# Patient Record
Sex: Female | Born: 1945 | Race: Black or African American | Hispanic: No | Smoking: Never smoker
Health system: Southern US, Community
[De-identification: ages and names within clinical notes are randomized; demographics above are authoritative.]

## PROBLEM LIST (undated history)

## (undated) DIAGNOSIS — G4730 Sleep apnea, unspecified: Secondary | ICD-10-CM

## (undated) DIAGNOSIS — C50919 Malignant neoplasm of unspecified site of unspecified female breast: Secondary | ICD-10-CM

## (undated) DIAGNOSIS — I780 Hereditary hemorrhagic telangiectasia: Secondary | ICD-10-CM

## (undated) DIAGNOSIS — D649 Anemia, unspecified: Secondary | ICD-10-CM

## (undated) DIAGNOSIS — I639 Cerebral infarction, unspecified: Secondary | ICD-10-CM

## (undated) DIAGNOSIS — J449 Chronic obstructive pulmonary disease, unspecified: Secondary | ICD-10-CM

## (undated) DIAGNOSIS — K219 Gastro-esophageal reflux disease without esophagitis: Secondary | ICD-10-CM

## (undated) DIAGNOSIS — G5730 Lesion of lateral popliteal nerve, unspecified lower limb: Secondary | ICD-10-CM

## (undated) DIAGNOSIS — R58 Hemorrhage, not elsewhere classified: Secondary | ICD-10-CM

## (undated) DIAGNOSIS — M5136 Other intervertebral disc degeneration, lumbar region: Secondary | ICD-10-CM

## (undated) DIAGNOSIS — H3320 Serous retinal detachment, unspecified eye: Secondary | ICD-10-CM

## (undated) DIAGNOSIS — I5020 Unspecified systolic (congestive) heart failure: Secondary | ICD-10-CM

## (undated) DIAGNOSIS — N3281 Overactive bladder: Secondary | ICD-10-CM

## (undated) DIAGNOSIS — I272 Pulmonary hypertension, unspecified: Secondary | ICD-10-CM

## (undated) DIAGNOSIS — I471 Supraventricular tachycardia: Secondary | ICD-10-CM

## (undated) DIAGNOSIS — J309 Allergic rhinitis, unspecified: Secondary | ICD-10-CM

## (undated) HISTORY — PX: ANEURYSM COILING: SHX5349

## (undated) HISTORY — PX: BREAST SURGERY: SHX581

## (undated) HISTORY — DX: Allergic rhinitis, unspecified: J30.9

## (undated) HISTORY — DX: Overactive bladder: N32.81

## (undated) HISTORY — DX: Anemia, unspecified: D64.9

## (undated) HISTORY — DX: Cerebral infarction, unspecified: I63.9

## (undated) HISTORY — PX: ABDOMINAL HYSTERECTOMY: SHX81

## (undated) HISTORY — DX: Gastro-esophageal reflux disease without esophagitis: K21.9

## (undated) HISTORY — DX: Malignant neoplasm of unspecified site of unspecified female breast: C50.919

## (undated) HISTORY — DX: Unspecified systolic (congestive) heart failure: I50.20

## (undated) HISTORY — PX: LUNG SURGERY: SHX703

## (undated) HISTORY — DX: Sleep apnea, unspecified: G47.30

## (undated) HISTORY — DX: Serous retinal detachment, unspecified eye: H33.20

## (undated) HISTORY — DX: Lesion of lateral popliteal nerve, unspecified lower limb: G57.30

## (undated) HISTORY — DX: Pulmonary hypertension, unspecified: I27.20

## (undated) HISTORY — DX: Other intervertebral disc degeneration, lumbar region: M51.36

## (undated) HISTORY — DX: Hemorrhage, not elsewhere classified: R58

## (undated) HISTORY — DX: Supraventricular tachycardia: I47.1

## (undated) HISTORY — DX: Chronic obstructive pulmonary disease, unspecified: J44.9

---

## 2004-03-14 ENCOUNTER — Emergency Department (HOSPITAL_COMMUNITY): Admission: EM | Admit: 2004-03-14 | Discharge: 2004-03-14 | Payer: Self-pay | Admitting: Emergency Medicine

## 2004-04-06 ENCOUNTER — Encounter (HOSPITAL_COMMUNITY): Admission: RE | Admit: 2004-04-06 | Discharge: 2004-04-10 | Payer: Self-pay | Admitting: Internal Medicine

## 2004-05-11 ENCOUNTER — Ambulatory Visit: Payer: Self-pay | Admitting: Oncology

## 2004-05-14 ENCOUNTER — Encounter (HOSPITAL_COMMUNITY): Admission: RE | Admit: 2004-05-14 | Discharge: 2004-08-12 | Payer: Self-pay | Admitting: Internal Medicine

## 2004-07-12 ENCOUNTER — Ambulatory Visit: Payer: Self-pay | Admitting: Oncology

## 2004-08-28 ENCOUNTER — Ambulatory Visit: Payer: Self-pay | Admitting: Oncology

## 2004-09-22 ENCOUNTER — Ambulatory Visit (HOSPITAL_COMMUNITY): Admission: AD | Admit: 2004-09-22 | Discharge: 2004-09-22 | Payer: Self-pay | Admitting: Pediatrics

## 2004-10-15 ENCOUNTER — Ambulatory Visit: Payer: Self-pay | Admitting: Oncology

## 2004-11-23 ENCOUNTER — Encounter (HOSPITAL_COMMUNITY): Admission: RE | Admit: 2004-11-23 | Discharge: 2005-02-21 | Payer: Self-pay | Admitting: Internal Medicine

## 2004-12-11 ENCOUNTER — Ambulatory Visit: Payer: Self-pay | Admitting: Hematology & Oncology

## 2004-12-18 ENCOUNTER — Ambulatory Visit (HOSPITAL_COMMUNITY): Admission: RE | Admit: 2004-12-18 | Discharge: 2004-12-18 | Payer: Self-pay | Admitting: Gastroenterology

## 2005-01-07 ENCOUNTER — Ambulatory Visit (HOSPITAL_COMMUNITY): Admission: RE | Admit: 2005-01-07 | Discharge: 2005-01-07 | Payer: Self-pay | Admitting: Gastroenterology

## 2005-02-11 ENCOUNTER — Ambulatory Visit: Payer: Self-pay | Admitting: Hematology & Oncology

## 2005-04-08 ENCOUNTER — Ambulatory Visit: Payer: Self-pay | Admitting: Hematology & Oncology

## 2005-04-23 ENCOUNTER — Ambulatory Visit (HOSPITAL_COMMUNITY): Admission: RE | Admit: 2005-04-23 | Discharge: 2005-04-23 | Payer: Self-pay | Admitting: Internal Medicine

## 2005-05-17 ENCOUNTER — Ambulatory Visit: Payer: Self-pay | Admitting: Hematology & Oncology

## 2005-06-20 ENCOUNTER — Ambulatory Visit (HOSPITAL_COMMUNITY): Admission: RE | Admit: 2005-06-20 | Discharge: 2005-06-20 | Payer: Self-pay | Admitting: Internal Medicine

## 2005-07-05 ENCOUNTER — Ambulatory Visit: Payer: Self-pay | Admitting: Hematology & Oncology

## 2005-07-05 LAB — CBC WITH DIFFERENTIAL/PLATELET
Basophils Absolute: 0 10*3/uL (ref 0.0–0.1)
Eosinophils Absolute: 0.2 10*3/uL (ref 0.0–0.5)
HGB: 9.8 g/dL — ABNORMAL LOW (ref 11.6–15.9)
NEUT#: 3.4 10*3/uL (ref 1.5–6.5)
RDW: 24.5 % — ABNORMAL HIGH (ref 11.3–14.5)
lymph#: 1 10*3/uL (ref 0.9–3.3)

## 2005-07-08 LAB — TRANSFERRIN RECEPTOR, SOLUABLE: Transferrin Receptor, Soluble: 13.5 mg/L — ABNORMAL HIGH (ref 1.9–4.4)

## 2005-08-12 LAB — CBC WITH DIFFERENTIAL/PLATELET
Eosinophils Absolute: 0.2 10*3/uL (ref 0.0–0.5)
MONO#: 0.4 10*3/uL (ref 0.1–0.9)
NEUT#: 3.2 10*3/uL (ref 1.5–6.5)
Platelets: 444 10*3/uL — ABNORMAL HIGH (ref 145–400)
RBC: 3.79 10*6/uL (ref 3.70–5.32)
RDW: 25.5 % — ABNORMAL HIGH (ref 11.3–14.5)
WBC: 4.9 10*3/uL (ref 3.9–10.0)

## 2005-08-14 LAB — TRANSFERRIN RECEPTOR, SOLUABLE: Transferrin Receptor, Soluble: 14 mg/L — ABNORMAL HIGH (ref 1.9–4.4)

## 2005-08-14 LAB — FERRITIN: Ferritin: 161 ng/mL (ref 10–291)

## 2005-08-15 ENCOUNTER — Ambulatory Visit: Payer: Self-pay | Admitting: Hematology & Oncology

## 2005-09-05 ENCOUNTER — Emergency Department (HOSPITAL_COMMUNITY): Admission: EM | Admit: 2005-09-05 | Discharge: 2005-09-05 | Payer: Self-pay | Admitting: Family Medicine

## 2005-09-20 ENCOUNTER — Encounter: Admission: RE | Admit: 2005-09-20 | Discharge: 2005-09-20 | Payer: Self-pay | Admitting: Hematology & Oncology

## 2005-09-26 ENCOUNTER — Ambulatory Visit (HOSPITAL_COMMUNITY): Admission: RE | Admit: 2005-09-26 | Discharge: 2005-09-26 | Payer: Self-pay | Admitting: Gastroenterology

## 2005-10-08 ENCOUNTER — Other Ambulatory Visit: Admission: RE | Admit: 2005-10-08 | Discharge: 2005-10-08 | Payer: Self-pay | Admitting: Obstetrics and Gynecology

## 2005-10-09 ENCOUNTER — Emergency Department (HOSPITAL_COMMUNITY): Admission: EM | Admit: 2005-10-09 | Discharge: 2005-10-09 | Payer: Self-pay | Admitting: Family Medicine

## 2005-10-15 ENCOUNTER — Ambulatory Visit (HOSPITAL_COMMUNITY): Admission: RE | Admit: 2005-10-15 | Discharge: 2005-10-15 | Payer: Self-pay | Admitting: Gastroenterology

## 2005-10-17 ENCOUNTER — Ambulatory Visit: Payer: Self-pay | Admitting: Hematology & Oncology

## 2005-11-07 ENCOUNTER — Emergency Department (HOSPITAL_COMMUNITY): Admission: EM | Admit: 2005-11-07 | Discharge: 2005-11-07 | Payer: Self-pay | Admitting: Emergency Medicine

## 2005-12-02 ENCOUNTER — Ambulatory Visit: Payer: Self-pay | Admitting: Hematology & Oncology

## 2005-12-02 LAB — CBC WITH DIFFERENTIAL/PLATELET
Eosinophils Absolute: 0.2 10*3/uL (ref 0.0–0.5)
MCV: 81.1 fL (ref 81.0–101.0)
MONO#: 0.4 10*3/uL (ref 0.1–0.9)
MONO%: 8.4 % (ref 0.0–13.0)
NEUT#: 2.7 10*3/uL (ref 1.5–6.5)
RBC: 3.75 10*6/uL (ref 3.70–5.32)
RDW: 18.3 % — ABNORMAL HIGH (ref 11.3–14.5)
WBC: 4.6 10*3/uL (ref 3.9–10.0)
lymph#: 1.2 10*3/uL (ref 0.9–3.3)

## 2005-12-04 LAB — COMPREHENSIVE METABOLIC PANEL
Albumin: 4 g/dL (ref 3.5–5.2)
Alkaline Phosphatase: 176 U/L — ABNORMAL HIGH (ref 39–117)
Glucose, Bld: 102 mg/dL — ABNORMAL HIGH (ref 70–99)
Potassium: 4.1 mEq/L (ref 3.5–5.3)
Sodium: 139 mEq/L (ref 135–145)
Total Protein: 7 g/dL (ref 6.0–8.3)

## 2005-12-04 LAB — TRANSFERRIN RECEPTOR, SOLUABLE: Transferrin Receptor, Soluble: 16.2 mg/L — ABNORMAL HIGH (ref 1.9–4.4)

## 2005-12-13 ENCOUNTER — Encounter: Admission: RE | Admit: 2005-12-13 | Discharge: 2005-12-27 | Payer: Self-pay | Admitting: Family Medicine

## 2005-12-17 ENCOUNTER — Encounter: Admission: RE | Admit: 2005-12-17 | Discharge: 2005-12-17 | Payer: Self-pay | Admitting: Gastroenterology

## 2005-12-19 LAB — CBC WITH DIFFERENTIAL/PLATELET
BASO%: 1.5 % (ref 0.0–2.0)
Basophils Absolute: 0.1 10*3/uL (ref 0.0–0.1)
EOS%: 4.8 % (ref 0.0–7.0)
HCT: 27.6 % — ABNORMAL LOW (ref 34.8–46.6)
HGB: 9 g/dL — ABNORMAL LOW (ref 11.6–15.9)
MCH: 25.2 pg — ABNORMAL LOW (ref 26.0–34.0)
MONO#: 0.4 10*3/uL (ref 0.1–0.9)
NEUT#: 3.6 10*3/uL (ref 1.5–6.5)
NEUT%: 61.5 % (ref 39.6–76.8)
Platelets: 431 10*3/uL — ABNORMAL HIGH (ref 145–400)
RBC: 3.56 10*6/uL — ABNORMAL LOW (ref 3.70–5.32)
RDW: 17 % — ABNORMAL HIGH (ref 11.3–14.5)
WBC: 5.8 10*3/uL (ref 3.9–10.0)
lymph#: 1.4 10*3/uL (ref 0.9–3.3)

## 2005-12-21 LAB — FERRITIN: Ferritin: 10 ng/mL (ref 10–291)

## 2005-12-21 LAB — TRANSFERRIN RECEPTOR, SOLUABLE: Transferrin Receptor, Soluble: 20.4 mg/L — ABNORMAL HIGH (ref 1.9–4.4)

## 2006-01-06 ENCOUNTER — Ambulatory Visit (HOSPITAL_COMMUNITY): Admission: RE | Admit: 2006-01-06 | Discharge: 2006-01-06 | Payer: Self-pay | Admitting: *Deleted

## 2006-02-03 ENCOUNTER — Ambulatory Visit: Payer: Self-pay | Admitting: Hematology & Oncology

## 2006-02-10 LAB — CBC WITH DIFFERENTIAL/PLATELET
Basophils Absolute: 0 10*3/uL (ref 0.0–0.1)
Eosinophils Absolute: 0.2 10*3/uL (ref 0.0–0.5)
HGB: 9.8 g/dL — ABNORMAL LOW (ref 11.6–15.9)
MONO#: 0.4 10*3/uL (ref 0.1–0.9)
MONO%: 8.6 % (ref 0.0–13.0)
NEUT#: 2.8 10*3/uL (ref 1.5–6.5)
RBC: 3.79 10*6/uL (ref 3.70–5.32)
RDW: 22.9 % — ABNORMAL HIGH (ref 11.3–14.5)
WBC: 4.5 10*3/uL (ref 3.9–10.0)
lymph#: 1.1 10*3/uL (ref 0.9–3.3)

## 2006-02-12 LAB — FERRITIN: Ferritin: 43 ng/mL (ref 10–291)

## 2006-02-12 LAB — TRANSFERRIN RECEPTOR, SOLUABLE: Transferrin Receptor, Soluble: 15.1 mg/L — ABNORMAL HIGH (ref 1.9–4.4)

## 2006-03-25 ENCOUNTER — Ambulatory Visit: Payer: Self-pay | Admitting: Hematology & Oncology

## 2006-04-14 LAB — CBC WITH DIFFERENTIAL/PLATELET
BASO%: 0.9 % (ref 0.0–2.0)
Basophils Absolute: 0 10*3/uL (ref 0.0–0.1)
EOS%: 4 % (ref 0.0–7.0)
Eosinophils Absolute: 0.2 10*3/uL (ref 0.0–0.5)
HCT: 28.2 % — ABNORMAL LOW (ref 34.8–46.6)
HGB: 8.8 g/dL — ABNORMAL LOW (ref 11.6–15.9)
LYMPH%: 21.2 % (ref 14.0–48.0)
MCH: 25.2 pg — ABNORMAL LOW (ref 26.0–34.0)
MCHC: 31.2 g/dL — ABNORMAL LOW (ref 32.0–36.0)
MCV: 80.7 fL — ABNORMAL LOW (ref 81.0–101.0)
MONO#: 0.3 10*3/uL (ref 0.1–0.9)
MONO%: 6.7 % (ref 0.0–13.0)
NEUT#: 3.5 10*3/uL (ref 1.5–6.5)
NEUT%: 67.2 % (ref 39.6–76.8)
Platelets: 471 10*3/uL — ABNORMAL HIGH (ref 145–400)
RBC: 3.49 10*6/uL — ABNORMAL LOW (ref 3.70–5.32)
RDW: 20.3 % — ABNORMAL HIGH (ref 11.3–14.5)
WBC: 5.2 10*3/uL (ref 3.9–10.0)
lymph#: 1.1 10*3/uL (ref 0.9–3.3)

## 2006-04-14 LAB — FERRITIN: Ferritin: 17 ng/mL (ref 10–291)

## 2006-05-07 ENCOUNTER — Inpatient Hospital Stay (HOSPITAL_COMMUNITY): Admission: EM | Admit: 2006-05-07 | Discharge: 2006-05-08 | Payer: Self-pay | Admitting: Emergency Medicine

## 2006-06-09 ENCOUNTER — Ambulatory Visit: Payer: Self-pay | Admitting: Hematology & Oncology

## 2006-06-11 LAB — CBC WITH DIFFERENTIAL/PLATELET
Eosinophils Absolute: 0.2 10*3/uL (ref 0.0–0.5)
HCT: 32.3 % — ABNORMAL LOW (ref 34.8–46.6)
HGB: 9.4 g/dL — ABNORMAL LOW (ref 11.6–15.9)
LYMPH%: 23.7 % (ref 14.0–48.0)
MONO#: 0.7 10*3/uL (ref 0.1–0.9)
NEUT#: 2.8 10*3/uL (ref 1.5–6.5)
NEUT%: 56.2 % (ref 39.6–76.8)
Platelets: 455 10*3/uL — ABNORMAL HIGH (ref 145–400)
WBC: 4.9 10*3/uL (ref 3.9–10.0)
lymph#: 1.2 10*3/uL (ref 0.9–3.3)

## 2006-06-13 ENCOUNTER — Ambulatory Visit (HOSPITAL_BASED_OUTPATIENT_CLINIC_OR_DEPARTMENT_OTHER): Admission: RE | Admit: 2006-06-13 | Discharge: 2006-06-13 | Payer: Self-pay | Admitting: Internal Medicine

## 2006-06-22 ENCOUNTER — Ambulatory Visit: Payer: Self-pay | Admitting: Internal Medicine

## 2006-07-16 ENCOUNTER — Encounter (HOSPITAL_COMMUNITY): Admission: RE | Admit: 2006-07-16 | Discharge: 2006-08-09 | Payer: Self-pay | Admitting: Hematology & Oncology

## 2006-07-16 LAB — CBC & DIFF AND RETIC
Basophils Absolute: 0.1 10*3/uL (ref 0.0–0.1)
EOS%: 4.1 % (ref 0.0–7.0)
Eosinophils Absolute: 0.2 10*3/uL (ref 0.0–0.5)
HCT: 26.2 % — ABNORMAL LOW (ref 34.8–46.6)
HGB: 7.5 g/dL — ABNORMAL LOW (ref 11.6–15.9)
IRF: 0.46 — ABNORMAL HIGH (ref 0.130–0.330)
MCH: 21.2 pg — ABNORMAL LOW (ref 26.0–34.0)
MCV: 73.7 fL — ABNORMAL LOW (ref 81.0–101.0)
MONO%: 8.2 % (ref 0.0–13.0)
NEUT#: 2.8 10*3/uL (ref 1.5–6.5)
NEUT%: 57.9 % (ref 39.6–76.8)
RDW: 17.5 % — ABNORMAL HIGH (ref 11.3–14.5)
RETIC #: 99 10*3/uL (ref 19.7–115.1)
lymph#: 1.4 10*3/uL (ref 0.9–3.3)

## 2006-07-18 LAB — TRANSFERRIN RECEPTOR, SOLUABLE: Transferrin Receptor, Soluble: 116.3 nmol/L

## 2006-07-18 LAB — TYPE & CROSSMATCH - CHCC

## 2006-07-23 ENCOUNTER — Ambulatory Visit: Payer: Self-pay | Admitting: Hematology & Oncology

## 2006-07-30 LAB — CBC & DIFF AND RETIC
BASO%: 1.6 % (ref 0.0–2.0)
Eosinophils Absolute: 0.4 10*3/uL (ref 0.0–0.5)
MCV: 77.2 fL — ABNORMAL LOW (ref 81.0–101.0)
MONO#: 0.6 10*3/uL (ref 0.1–0.9)
MONO%: 8.4 % (ref 0.0–13.0)
NEUT#: 5 10*3/uL (ref 1.5–6.5)
RBC: 4.47 10*6/uL (ref 3.70–5.32)
RDW: 21.8 % — ABNORMAL HIGH (ref 11.3–14.5)
RETIC #: 190 10*3/uL — ABNORMAL HIGH (ref 19.7–115.1)
Retic %: 4.3 % — ABNORMAL HIGH (ref 0.4–2.3)
WBC: 7.2 10*3/uL (ref 3.9–10.0)

## 2006-07-30 LAB — FERRITIN: Ferritin: 530 ng/mL — ABNORMAL HIGH (ref 10–291)

## 2006-08-26 LAB — CBC WITH DIFFERENTIAL/PLATELET
BASO%: 1.3 % (ref 0.0–2.0)
EOS%: 7.4 % — ABNORMAL HIGH (ref 0.0–7.0)
HCT: 36.8 % (ref 34.8–46.6)
LYMPH%: 35.2 % (ref 14.0–48.0)
MCH: 26 pg (ref 26.0–34.0)
MCHC: 31.5 g/dL — ABNORMAL LOW (ref 32.0–36.0)
MCV: 82.6 fL (ref 81.0–101.0)
MONO%: 5.7 % (ref 0.0–13.0)
NEUT%: 50.4 % (ref 39.6–76.8)
Platelets: 302 10*3/uL (ref 145–400)

## 2006-09-19 ENCOUNTER — Ambulatory Visit: Payer: Self-pay | Admitting: Hematology & Oncology

## 2006-09-19 LAB — CBC WITH DIFFERENTIAL/PLATELET
EOS%: 3.9 % (ref 0.0–7.0)
LYMPH%: 22.4 % (ref 14.0–48.0)
MCH: 26.2 pg (ref 26.0–34.0)
MCV: 82.8 fL (ref 81.0–101.0)
MONO%: 10.5 % (ref 0.0–13.0)
RBC: 3.82 10*6/uL (ref 3.70–5.32)
RDW: 21.6 % — ABNORMAL HIGH (ref 11.3–14.5)

## 2006-09-19 LAB — FERRITIN: Ferritin: 25 ng/mL (ref 10–291)

## 2006-11-12 ENCOUNTER — Ambulatory Visit: Payer: Self-pay | Admitting: Hematology & Oncology

## 2006-11-12 LAB — CBC WITH DIFFERENTIAL/PLATELET
BASO%: 0.2 % (ref 0.0–2.0)
EOS%: 5.1 % (ref 0.0–7.0)
LYMPH%: 35.5 % (ref 14.0–48.0)
MCH: 27.7 pg (ref 26.0–34.0)
MCHC: 33 g/dL (ref 32.0–36.0)
MONO#: 0.3 10*3/uL (ref 0.1–0.9)
Platelets: 310 10*3/uL (ref 145–400)
RBC: 4.13 10*6/uL (ref 3.70–5.32)
WBC: 4.9 10*3/uL (ref 3.9–10.0)
lymph#: 1.7 10*3/uL (ref 0.9–3.3)

## 2006-11-12 LAB — FERRITIN: Ferritin: 90 ng/mL (ref 10–291)

## 2006-11-25 ENCOUNTER — Inpatient Hospital Stay (HOSPITAL_COMMUNITY): Admission: RE | Admit: 2006-11-25 | Discharge: 2006-11-26 | Payer: Self-pay | Admitting: Interventional Radiology

## 2006-12-02 LAB — CBC WITH DIFFERENTIAL/PLATELET
BASO%: 0.8 % (ref 0.0–2.0)
Basophils Absolute: 0 10*3/uL (ref 0.0–0.1)
HCT: 30.5 % — ABNORMAL LOW (ref 34.8–46.6)
HGB: 9.9 g/dL — ABNORMAL LOW (ref 11.6–15.9)
MCHC: 32.5 g/dL (ref 32.0–36.0)
MONO#: 0.3 10*3/uL (ref 0.1–0.9)
NEUT%: 68.7 % (ref 39.6–76.8)
RDW: 19.9 % — ABNORMAL HIGH (ref 11.3–14.5)
WBC: 4.9 10*3/uL (ref 3.9–10.0)
lymph#: 1 10*3/uL (ref 0.9–3.3)

## 2006-12-30 ENCOUNTER — Inpatient Hospital Stay (HOSPITAL_COMMUNITY): Admission: EM | Admit: 2006-12-30 | Discharge: 2007-01-01 | Payer: Self-pay | Admitting: Emergency Medicine

## 2006-12-30 ENCOUNTER — Ambulatory Visit: Payer: Self-pay | Admitting: Cardiology

## 2006-12-30 ENCOUNTER — Encounter (INDEPENDENT_AMBULATORY_CARE_PROVIDER_SITE_OTHER): Payer: Self-pay | Admitting: Neurology

## 2006-12-31 ENCOUNTER — Encounter (INDEPENDENT_AMBULATORY_CARE_PROVIDER_SITE_OTHER): Payer: Self-pay | Admitting: Neurology

## 2007-01-14 ENCOUNTER — Encounter: Admission: RE | Admit: 2007-01-14 | Discharge: 2007-03-11 | Payer: Self-pay | Admitting: Neurology

## 2007-01-19 ENCOUNTER — Ambulatory Visit: Payer: Self-pay | Admitting: Hematology & Oncology

## 2007-01-19 LAB — CBC WITH DIFFERENTIAL/PLATELET
BASO%: 0 % (ref 0.0–2.0)
Eosinophils Absolute: 0.2 10*3/uL (ref 0.0–0.5)
LYMPH%: 25.7 % (ref 14.0–48.0)
MCHC: 31.6 g/dL — ABNORMAL LOW (ref 32.0–36.0)
MONO#: 0.4 10*3/uL (ref 0.1–0.9)
NEUT#: 3 10*3/uL (ref 1.5–6.5)
Platelets: 437 10*3/uL — ABNORMAL HIGH (ref 145–400)
RBC: 3.51 10*6/uL — ABNORMAL LOW (ref 3.70–5.32)
WBC: 4.8 10*3/uL (ref 3.9–10.0)
lymph#: 1.2 10*3/uL (ref 0.9–3.3)

## 2007-01-19 LAB — FERRITIN: Ferritin: 62 ng/mL (ref 10–291)

## 2007-01-28 ENCOUNTER — Ambulatory Visit (HOSPITAL_COMMUNITY): Admission: RE | Admit: 2007-01-28 | Discharge: 2007-01-28 | Payer: Self-pay | Admitting: Hematology & Oncology

## 2007-02-24 ENCOUNTER — Encounter (HOSPITAL_COMMUNITY): Admission: RE | Admit: 2007-02-24 | Discharge: 2007-03-11 | Payer: Self-pay | Admitting: Hematology & Oncology

## 2007-02-24 LAB — FERRITIN: Ferritin: 14 ng/mL (ref 10–291)

## 2007-02-24 LAB — CBC WITH DIFFERENTIAL/PLATELET
BASO%: 2.1 % — ABNORMAL HIGH (ref 0.0–2.0)
LYMPH%: 23 % (ref 14.0–48.0)
MCHC: 29.3 g/dL — ABNORMAL LOW (ref 32.0–36.0)
MONO#: 0.4 10*3/uL (ref 0.1–0.9)
RBC: 3.32 10*6/uL — ABNORMAL LOW (ref 3.70–5.32)
RDW: 16.4 % — ABNORMAL HIGH (ref 11.3–14.5)
WBC: 4.3 10*3/uL (ref 3.9–10.0)
lymph#: 1 10*3/uL (ref 0.9–3.3)

## 2007-02-25 LAB — TYPE & CROSSMATCH - CHCC

## 2007-03-09 ENCOUNTER — Ambulatory Visit: Payer: Self-pay | Admitting: Hematology & Oncology

## 2007-03-18 LAB — CBC WITH DIFFERENTIAL/PLATELET
Eosinophils Absolute: 0.2 10*3/uL (ref 0.0–0.5)
MCV: 84.6 fL (ref 81.0–101.0)
MONO%: 7.9 % (ref 0.0–13.0)
NEUT#: 4.5 10*3/uL (ref 1.5–6.5)
RBC: 3.8 10*6/uL (ref 3.70–5.32)
RDW: 24.2 % — ABNORMAL HIGH (ref 11.3–14.5)
WBC: 6.3 10*3/uL (ref 3.9–10.0)

## 2007-03-18 LAB — RETICULOCYTES
RETIC #: 229.2 10*3/uL — ABNORMAL HIGH (ref 19.7–115.1)
Retic %: 6.1 % — ABNORMAL HIGH (ref 0.4–2.3)

## 2007-03-20 ENCOUNTER — Ambulatory Visit (HOSPITAL_COMMUNITY): Admission: RE | Admit: 2007-03-20 | Discharge: 2007-03-20 | Payer: Self-pay | Admitting: Interventional Radiology

## 2007-04-06 LAB — CBC & DIFF AND RETIC
Basophils Absolute: 0.1 10*3/uL (ref 0.0–0.1)
Eosinophils Absolute: 0.2 10*3/uL (ref 0.0–0.5)
HCT: 29.9 % — ABNORMAL LOW (ref 34.8–46.6)
LYMPH%: 20.2 % (ref 14.0–48.0)
MCV: 90.6 fL (ref 81.0–101.0)
MONO#: 0.3 10*3/uL (ref 0.1–0.9)
MONO%: 7.1 % (ref 0.0–13.0)
NEUT#: 3.3 10*3/uL (ref 1.5–6.5)
NEUT%: 67.4 % (ref 39.6–76.8)
Platelets: 441 10*3/uL — ABNORMAL HIGH (ref 145–400)
WBC: 4.9 10*3/uL (ref 3.9–10.0)

## 2007-04-06 LAB — FERRITIN: Ferritin: 90 ng/mL (ref 10–291)

## 2007-04-27 ENCOUNTER — Ambulatory Visit: Payer: Self-pay | Admitting: *Deleted

## 2007-04-27 ENCOUNTER — Ambulatory Visit (HOSPITAL_COMMUNITY): Admission: RE | Admit: 2007-04-27 | Discharge: 2007-04-27 | Payer: Self-pay | Admitting: Interventional Radiology

## 2007-04-27 ENCOUNTER — Encounter (INDEPENDENT_AMBULATORY_CARE_PROVIDER_SITE_OTHER): Payer: Self-pay | Admitting: Interventional Radiology

## 2007-04-30 ENCOUNTER — Ambulatory Visit: Payer: Self-pay | Admitting: Hematology & Oncology

## 2007-05-05 LAB — CBC & DIFF AND RETIC
BASO%: 0.1 % (ref 0.0–2.0)
EOS%: 3 % (ref 0.0–7.0)
HCT: 33.1 % — ABNORMAL LOW (ref 34.8–46.6)
LYMPH%: 19.7 % (ref 14.0–48.0)
MCH: 28.3 pg (ref 26.0–34.0)
MCHC: 31.8 g/dL — ABNORMAL LOW (ref 32.0–36.0)
MONO%: 1 % (ref 0.0–13.0)
NEUT%: 76.2 % (ref 39.6–76.8)
Platelets: 418 10*3/uL — ABNORMAL HIGH (ref 145–400)
RBC: 3.72 10*6/uL (ref 3.70–5.32)
Retic %: 4.1 % — ABNORMAL HIGH (ref 0.4–2.3)

## 2007-05-05 LAB — FERRITIN: Ferritin: 293 ng/mL — ABNORMAL HIGH (ref 10–291)

## 2007-05-11 ENCOUNTER — Emergency Department (HOSPITAL_COMMUNITY): Admission: EM | Admit: 2007-05-11 | Discharge: 2007-05-11 | Payer: Self-pay | Admitting: Family Medicine

## 2007-05-18 ENCOUNTER — Emergency Department (HOSPITAL_COMMUNITY): Admission: EM | Admit: 2007-05-18 | Discharge: 2007-05-19 | Payer: Self-pay | Admitting: Emergency Medicine

## 2007-05-19 LAB — CBC WITH DIFFERENTIAL/PLATELET
Basophils Absolute: 0.1 10*3/uL (ref 0.0–0.1)
EOS%: 3.4 % (ref 0.0–7.0)
Eosinophils Absolute: 0.2 10*3/uL (ref 0.0–0.5)
HGB: 10 g/dL — ABNORMAL LOW (ref 11.6–15.9)
LYMPH%: 23.9 % (ref 14.0–48.0)
MCH: 28 pg (ref 26.0–34.0)
MCV: 93 fL (ref 81.0–101.0)
MONO%: 8.2 % (ref 0.0–13.0)
NEUT#: 3.2 10*3/uL (ref 1.5–6.5)
Platelets: 326 10*3/uL (ref 145–400)
RBC: 3.57 10*6/uL — ABNORMAL LOW (ref 3.70–5.32)

## 2007-06-02 ENCOUNTER — Ambulatory Visit (HOSPITAL_COMMUNITY): Admission: RE | Admit: 2007-06-02 | Discharge: 2007-06-02 | Payer: Self-pay | Admitting: Hematology & Oncology

## 2007-06-17 ENCOUNTER — Ambulatory Visit: Payer: Self-pay | Admitting: Hematology & Oncology

## 2007-06-19 ENCOUNTER — Emergency Department (HOSPITAL_COMMUNITY): Admission: EM | Admit: 2007-06-19 | Discharge: 2007-06-19 | Payer: Self-pay | Admitting: Emergency Medicine

## 2007-06-23 LAB — CBC & DIFF AND RETIC
BASO%: 0 % (ref 0.0–2.0)
EOS%: 5.5 % (ref 0.0–7.0)
HCT: 32.8 % — ABNORMAL LOW (ref 34.8–46.6)
IRF: 0.57 — ABNORMAL HIGH (ref 0.130–0.330)
MCH: 26.9 pg (ref 26.0–34.0)
MCHC: 31.6 g/dL — ABNORMAL LOW (ref 32.0–36.0)
MONO#: 0.5 10*3/uL (ref 0.1–0.9)
NEUT%: 60.2 % (ref 39.6–76.8)
RBC: 3.86 10*6/uL (ref 3.70–5.32)
Retic %: 4.7 % — ABNORMAL HIGH (ref 0.4–2.3)
WBC: 4 10*3/uL (ref 3.9–10.0)
lymph#: 0.9 10*3/uL (ref 0.9–3.3)

## 2007-06-25 LAB — TRANSFERRIN RECEPTOR, SOLUABLE: Transferrin Receptor, Soluble: 98.7 nmol/L

## 2007-06-25 LAB — FERRITIN: Ferritin: 163 ng/mL (ref 10–291)

## 2007-07-06 ENCOUNTER — Emergency Department (HOSPITAL_COMMUNITY): Admission: EM | Admit: 2007-07-06 | Discharge: 2007-07-06 | Payer: Self-pay | Admitting: Emergency Medicine

## 2007-07-27 ENCOUNTER — Ambulatory Visit: Payer: Self-pay | Admitting: Hematology & Oncology

## 2007-07-29 ENCOUNTER — Encounter (INDEPENDENT_AMBULATORY_CARE_PROVIDER_SITE_OTHER): Payer: Self-pay | Admitting: Internal Medicine

## 2007-07-29 ENCOUNTER — Ambulatory Visit: Payer: Self-pay | Admitting: Vascular Surgery

## 2007-07-29 ENCOUNTER — Encounter (INDEPENDENT_AMBULATORY_CARE_PROVIDER_SITE_OTHER): Payer: Self-pay | Admitting: Emergency Medicine

## 2007-07-29 ENCOUNTER — Observation Stay (HOSPITAL_COMMUNITY): Admission: EM | Admit: 2007-07-29 | Discharge: 2007-07-29 | Payer: Self-pay | Admitting: Emergency Medicine

## 2007-08-28 LAB — CBC WITH DIFFERENTIAL/PLATELET
BASO%: 0.9 % (ref 0.0–2.0)
HCT: 28.9 % — ABNORMAL LOW (ref 34.8–46.6)
HGB: 8.6 g/dL — ABNORMAL LOW (ref 11.6–15.9)
MCHC: 29.9 g/dL — ABNORMAL LOW (ref 32.0–36.0)
MONO#: 0.4 10*3/uL (ref 0.1–0.9)
NEUT%: 59.9 % (ref 39.6–76.8)
WBC: 5 10*3/uL (ref 3.9–10.0)
lymph#: 1.3 10*3/uL (ref 0.9–3.3)

## 2007-08-28 LAB — CHCC SMEAR

## 2007-09-15 ENCOUNTER — Ambulatory Visit: Payer: Self-pay | Admitting: Hematology & Oncology

## 2007-09-22 ENCOUNTER — Encounter: Payer: Self-pay | Admitting: Interventional Radiology

## 2007-10-15 LAB — CBC WITH DIFFERENTIAL (CANCER CENTER ONLY)
BASO#: 0 10*3/uL (ref 0.0–0.2)
BASO%: 0.4 % (ref 0.0–2.0)
HCT: 32.6 % — ABNORMAL LOW (ref 34.8–46.6)
HGB: 9.9 g/dL — ABNORMAL LOW (ref 11.6–15.9)
LYMPH#: 1 10*3/uL (ref 0.9–3.3)
LYMPH%: 19.9 % (ref 14.0–48.0)
MCHC: 30.5 g/dL — ABNORMAL LOW (ref 32.0–36.0)
MCV: 78 fL — ABNORMAL LOW (ref 81–101)
MONO#: 0.4 10*3/uL (ref 0.1–0.9)
NEUT%: 68.6 % (ref 39.6–80.0)
RDW: 17.8 % — ABNORMAL HIGH (ref 10.5–14.6)
WBC: 5 10*3/uL (ref 3.9–10.0)

## 2007-11-02 ENCOUNTER — Ambulatory Visit: Payer: Self-pay | Admitting: Hematology & Oncology

## 2007-11-02 LAB — CBC WITH DIFFERENTIAL/PLATELET
Eosinophils Absolute: 0.1 10*3/uL (ref 0.0–0.5)
HCT: 31.1 % — ABNORMAL LOW (ref 34.8–46.6)
LYMPH%: 25.8 % (ref 14.0–48.0)
MCV: 77.5 fL — ABNORMAL LOW (ref 81.0–101.0)
MONO#: 0.4 10*3/uL (ref 0.1–0.9)
MONO%: 9.5 % (ref 0.0–13.0)
NEUT#: 2.6 10*3/uL (ref 1.5–6.5)
NEUT%: 60.1 % (ref 39.6–76.8)
Platelets: 469 10*3/uL — ABNORMAL HIGH (ref 145–400)
RBC: 4.02 10*6/uL (ref 3.70–5.32)
WBC: 4.3 10*3/uL (ref 3.9–10.0)

## 2007-11-06 ENCOUNTER — Ambulatory Visit: Payer: Self-pay | Admitting: Hematology & Oncology

## 2007-12-24 ENCOUNTER — Ambulatory Visit: Payer: Self-pay | Admitting: Hematology & Oncology

## 2007-12-29 ENCOUNTER — Ambulatory Visit: Payer: Self-pay | Admitting: Hematology & Oncology

## 2007-12-29 LAB — CBC & DIFF AND RETIC
BASO%: 0.1 % (ref 0.0–2.0)
EOS%: 4 % (ref 0.0–7.0)
LYMPH%: 18.2 % (ref 14.0–48.0)
MCH: 25.8 pg — ABNORMAL LOW (ref 26.0–34.0)
MCHC: 31.1 g/dL — ABNORMAL LOW (ref 32.0–36.0)
MCV: 83.2 fL (ref 81.0–101.0)
MONO%: 5.5 % (ref 0.0–13.0)
Platelets: 398 10*3/uL (ref 145–400)
RBC: 3.92 10*6/uL (ref 3.70–5.32)
RDW: 21.8 % — ABNORMAL HIGH (ref 11.3–14.5)
RETIC #: 138.8 10*3/uL — ABNORMAL HIGH (ref 19.7–115.1)
Retic %: 3.5 % — ABNORMAL HIGH (ref 0.4–2.3)

## 2007-12-31 LAB — FERRITIN: Ferritin: 39 ng/mL (ref 10–291)

## 2007-12-31 LAB — TRANSFERRIN RECEPTOR, SOLUABLE: Transferrin Receptor, Soluble: 80.8 nmol/L

## 2008-01-09 ENCOUNTER — Emergency Department (HOSPITAL_COMMUNITY): Admission: EM | Admit: 2008-01-09 | Discharge: 2008-01-09 | Payer: Self-pay | Admitting: Emergency Medicine

## 2008-01-22 ENCOUNTER — Ambulatory Visit (HOSPITAL_BASED_OUTPATIENT_CLINIC_OR_DEPARTMENT_OTHER): Admission: RE | Admit: 2008-01-22 | Discharge: 2008-01-22 | Payer: Self-pay | Admitting: Otolaryngology

## 2008-01-22 ENCOUNTER — Encounter (INDEPENDENT_AMBULATORY_CARE_PROVIDER_SITE_OTHER): Payer: Self-pay | Admitting: Otolaryngology

## 2008-02-15 ENCOUNTER — Ambulatory Visit: Payer: Self-pay | Admitting: Hematology & Oncology

## 2008-02-18 ENCOUNTER — Ambulatory Visit: Payer: Self-pay | Admitting: Hematology & Oncology

## 2008-03-24 ENCOUNTER — Emergency Department (HOSPITAL_COMMUNITY): Admission: EM | Admit: 2008-03-24 | Discharge: 2008-03-24 | Payer: Self-pay | Admitting: Family Medicine

## 2008-04-29 ENCOUNTER — Ambulatory Visit: Payer: Self-pay | Admitting: Hematology & Oncology

## 2008-05-02 LAB — CBC WITH DIFFERENTIAL (CANCER CENTER ONLY)
BASO%: 0.4 % (ref 0.0–2.0)
EOS%: 5.6 % (ref 0.0–7.0)
HCT: 30.6 % — ABNORMAL LOW (ref 34.8–46.6)
LYMPH#: 1.1 10*3/uL (ref 0.9–3.3)
LYMPH%: 23.6 % (ref 14.0–48.0)
MCHC: 30.4 g/dL — ABNORMAL LOW (ref 32.0–36.0)
MCV: 83 fL (ref 81–101)
NEUT%: 63.3 % (ref 39.6–80.0)
RDW: 16.5 % — ABNORMAL HIGH (ref 10.5–14.6)

## 2008-05-02 LAB — TECHNOLOGIST REVIEW CHCC SATELLITE

## 2008-05-29 ENCOUNTER — Ambulatory Visit (HOSPITAL_BASED_OUTPATIENT_CLINIC_OR_DEPARTMENT_OTHER): Admission: RE | Admit: 2008-05-29 | Discharge: 2008-05-29 | Payer: Self-pay | Admitting: Internal Medicine

## 2008-06-08 LAB — COMPREHENSIVE METABOLIC PANEL
ALT: 23 U/L (ref 0–35)
AST: 15 U/L (ref 0–37)
CO2: 22 mEq/L (ref 19–32)
Calcium: 10.8 mg/dL — ABNORMAL HIGH (ref 8.4–10.5)
Chloride: 110 mEq/L (ref 96–112)
Creatinine, Ser: 0.75 mg/dL (ref 0.40–1.20)
Sodium: 141 mEq/L (ref 135–145)
Total Protein: 6.9 g/dL (ref 6.0–8.3)

## 2008-06-08 LAB — FERRITIN: Ferritin: 76 ng/mL (ref 10–291)

## 2008-06-08 LAB — CBC WITH DIFFERENTIAL (CANCER CENTER ONLY)
BASO%: 0.3 % (ref 0.0–2.0)
HCT: 33.9 % — ABNORMAL LOW (ref 34.8–46.6)
LYMPH%: 25.1 % (ref 14.0–48.0)
MCH: 26.5 pg (ref 26.0–34.0)
MCV: 87 fL (ref 81–101)
MONO#: 0.3 10*3/uL (ref 0.1–0.9)
MONO%: 7.1 % (ref 0.0–13.0)
NEUT%: 62.5 % (ref 39.6–80.0)
RDW: 15.9 % — ABNORMAL HIGH (ref 10.5–14.6)
WBC: 4.2 10*3/uL (ref 3.9–10.0)

## 2008-06-08 LAB — RETICULOCYTES (CHCC)
ABS Retic: 132.6 10*3/uL (ref 19.0–186.0)
Retic Ct Pct: 3.4 % — ABNORMAL HIGH (ref 0.4–3.1)

## 2008-06-12 ENCOUNTER — Ambulatory Visit: Payer: Self-pay | Admitting: Internal Medicine

## 2008-06-23 ENCOUNTER — Ambulatory Visit: Payer: Self-pay | Admitting: Hematology & Oncology

## 2008-06-24 LAB — TECHNOLOGIST REVIEW CHCC SATELLITE

## 2008-06-24 LAB — CBC WITH DIFFERENTIAL (CANCER CENTER ONLY)
BASO#: 0 10*3/uL (ref 0.0–0.2)
Eosinophils Absolute: 0.3 10*3/uL (ref 0.0–0.5)
HCT: 33.1 % — ABNORMAL LOW (ref 34.8–46.6)
HGB: 10.1 g/dL — ABNORMAL LOW (ref 11.6–15.9)
LYMPH%: 22.1 % (ref 14.0–48.0)
MCH: 25.8 pg — ABNORMAL LOW (ref 26.0–34.0)
MCV: 84 fL (ref 81–101)
MONO#: 0.4 10*3/uL (ref 0.1–0.9)
NEUT%: 62.9 % (ref 39.6–80.0)
Platelets: 413 10*3/uL — ABNORMAL HIGH (ref 145–400)
RBC: 3.92 10*6/uL (ref 3.70–5.32)
WBC: 4.7 10*3/uL (ref 3.9–10.0)

## 2008-06-24 LAB — RETICULOCYTES (CHCC): ABS Retic: 127.7 10*3/uL (ref 19.0–186.0)

## 2008-06-24 LAB — FERRITIN: Ferritin: 31 ng/mL (ref 10–291)

## 2008-07-26 ENCOUNTER — Emergency Department (HOSPITAL_COMMUNITY): Admission: EM | Admit: 2008-07-26 | Discharge: 2008-07-26 | Payer: Self-pay | Admitting: Family Medicine

## 2008-08-11 ENCOUNTER — Ambulatory Visit: Payer: Self-pay | Admitting: Hematology & Oncology

## 2008-08-12 LAB — CBC WITH DIFFERENTIAL (CANCER CENTER ONLY)
BASO#: 0 10*3/uL (ref 0.0–0.2)
Eosinophils Absolute: 0.3 10*3/uL (ref 0.0–0.5)
HGB: 10.5 g/dL — ABNORMAL LOW (ref 11.6–15.9)
LYMPH#: 1.3 10*3/uL (ref 0.9–3.3)
MONO#: 0.4 10*3/uL (ref 0.1–0.9)
NEUT#: 2.7 10*3/uL (ref 1.5–6.5)
Platelets: 351 10*3/uL (ref 145–400)
RBC: 4.03 10*6/uL (ref 3.70–5.32)
WBC: 4.8 10*3/uL (ref 3.9–10.0)

## 2008-08-12 LAB — RETICULOCYTES (CHCC)
RBC.: 4.05 MIL/uL (ref 3.87–5.11)
Retic Ct Pct: 2.2 % (ref 0.4–3.1)

## 2008-09-12 ENCOUNTER — Inpatient Hospital Stay (HOSPITAL_COMMUNITY): Admission: EM | Admit: 2008-09-12 | Discharge: 2008-09-15 | Payer: Self-pay | Admitting: Emergency Medicine

## 2008-09-12 IMAGING — CR DG CHEST 1V PORT
1 series · 1 of 1 positions shown · non-contrast
Comparison: none

CLINICAL DATA: Left-sided weakness

Portable chest at 7381:
Comparison CT chest 04/23/2005. Stable lines around the right hilum. Mild
enlargement of cardiac silhouette. No effusion. No confluent infiltrate. Right
subclavian Portacatheter extends to the low SVC.

[view not recorded]
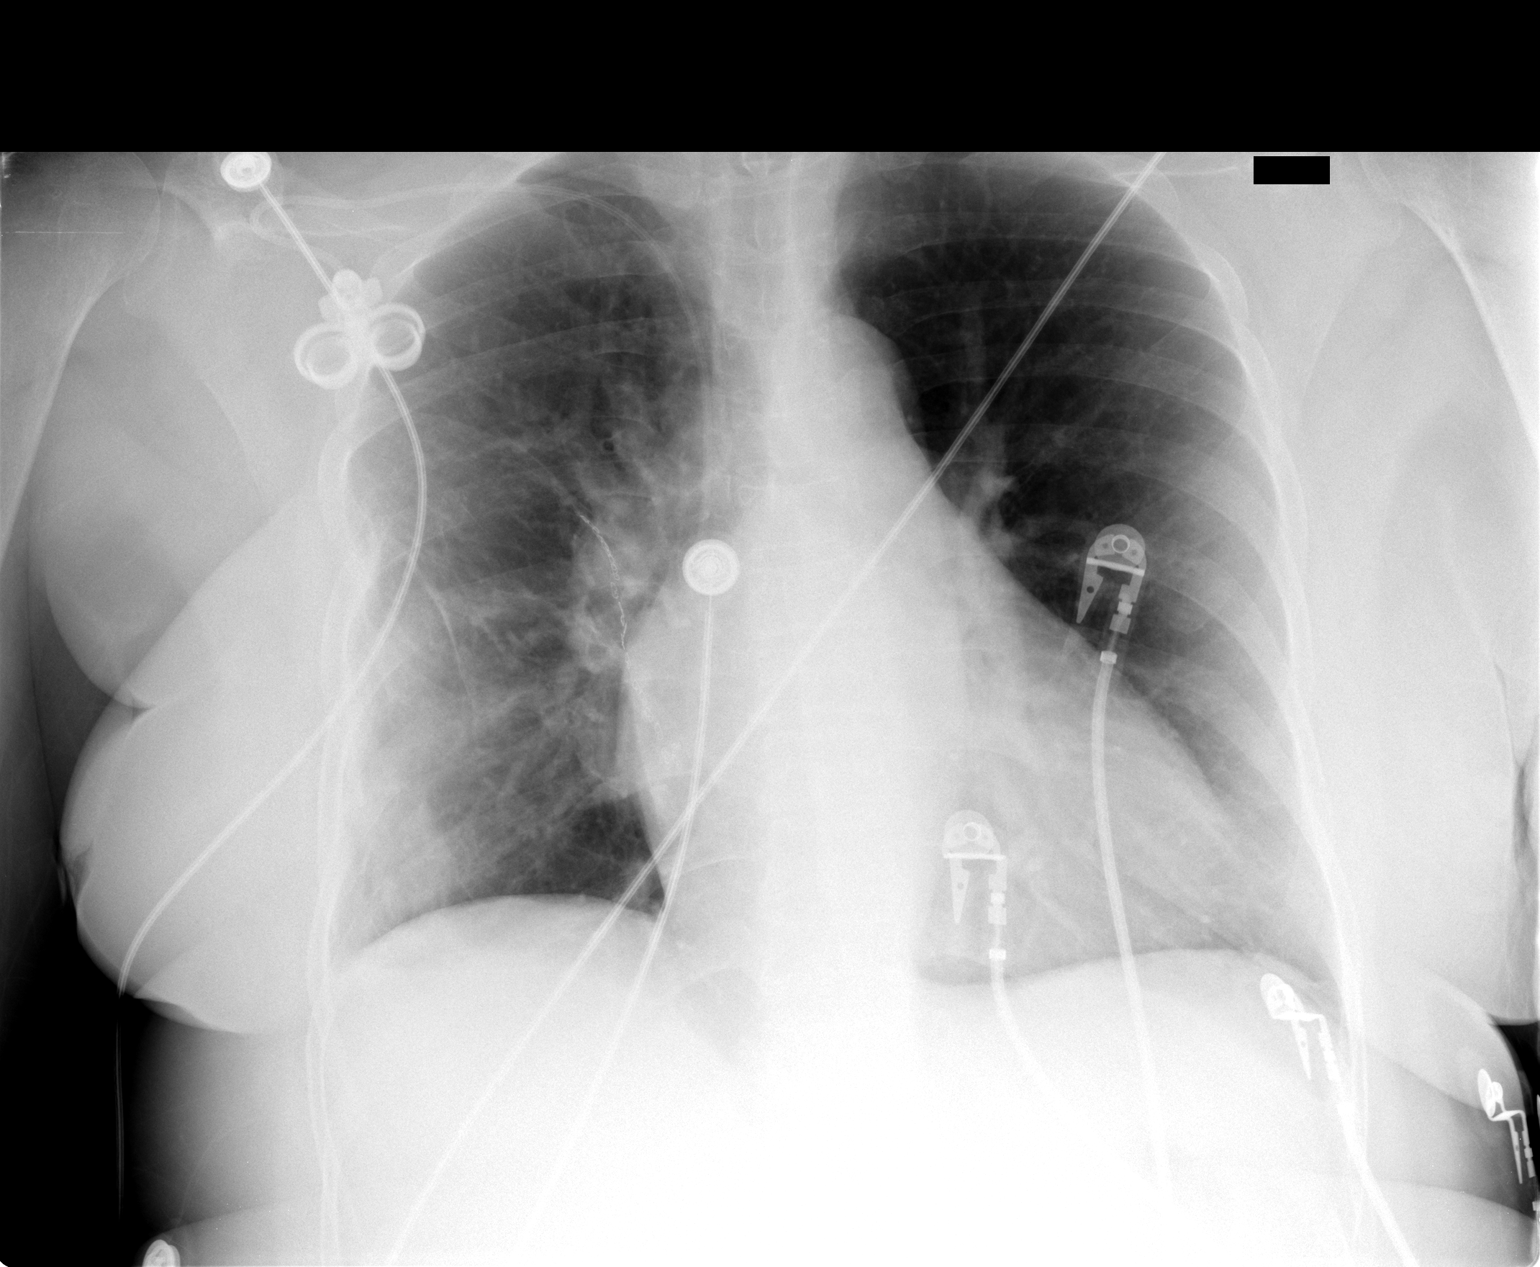

[1 of 1 positions shown; findings below may reference images not displayed]

IMPRESSION: 1. mild cardiomegaly.
2. Postop changes as above

## 2008-09-12 IMAGING — CT CT HEAD W/O CM
1 series · 16 of 29 positions shown, 20 images · IV contrast (agent unspecified)
Comparison: none

CLINICAL DATA: 60 year-old-female with weakness, code stroke, left-sided paresthesias.  
HEAD CT WITHOUT CONTRAST:
TECHNIQUE: Contiguous axial images were obtained from the base of the skull through the vertex according to standard protocol without contrast.

[Series 2: do not angle the gantry · axial · 0.47mm/px · z∈[+107,+237]mm · 16 of 29 slices shown, 20 images]
[im 2/29  brain]
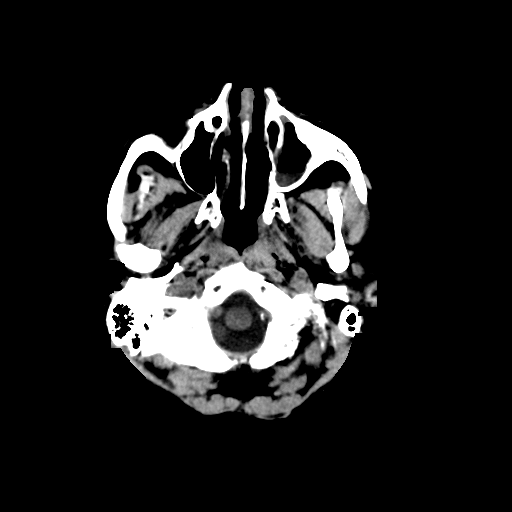
[im 2/29  bone]
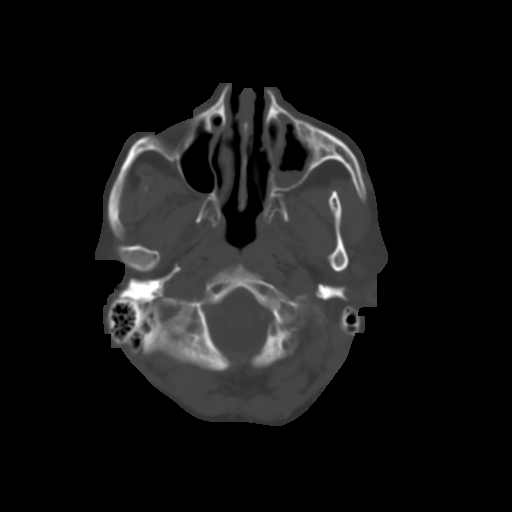
[im 4/29  brain]
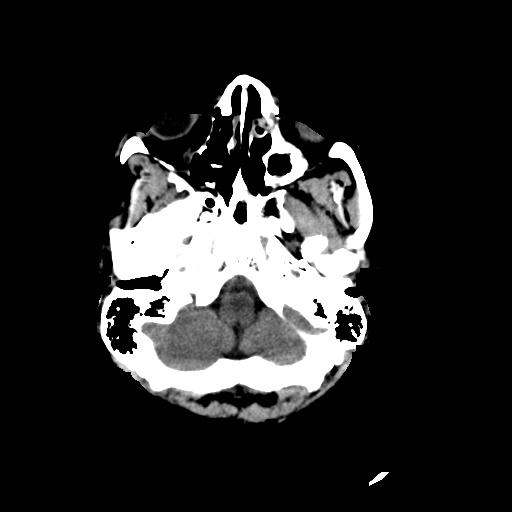
[im 6/29  brain]
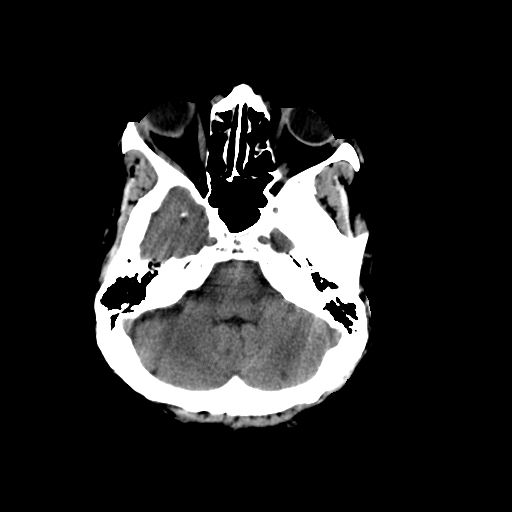
[im 7/29  brain]
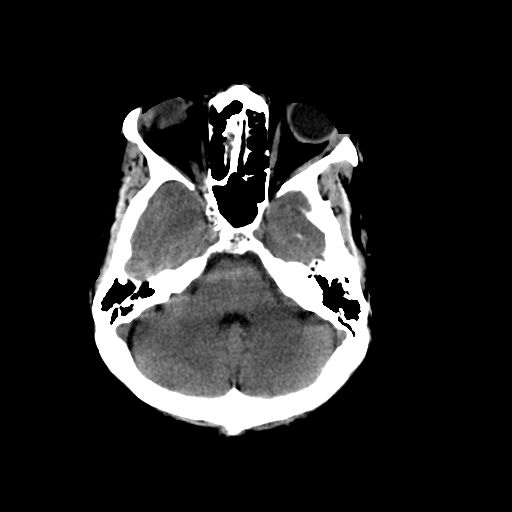
[im 9/29  brain]
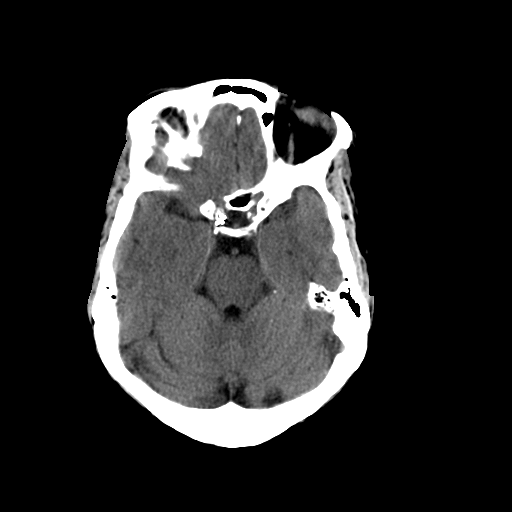
[im 9/29  bone]
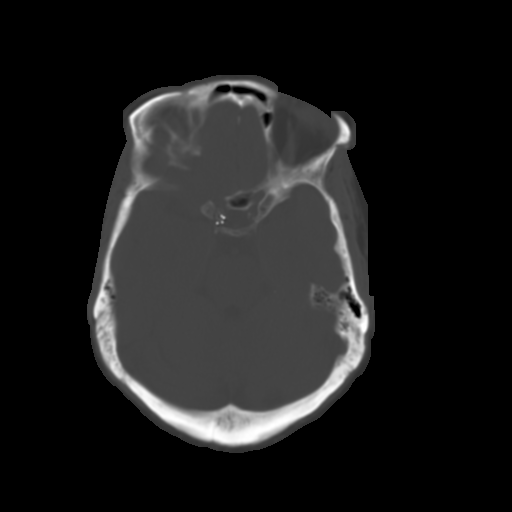
[im 11/29  brain]
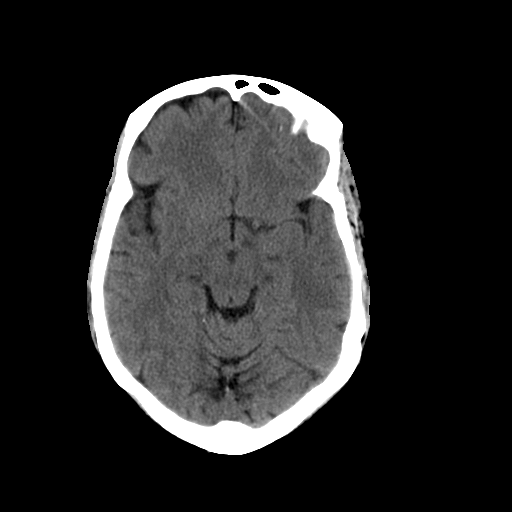
[im 12/29  brain]
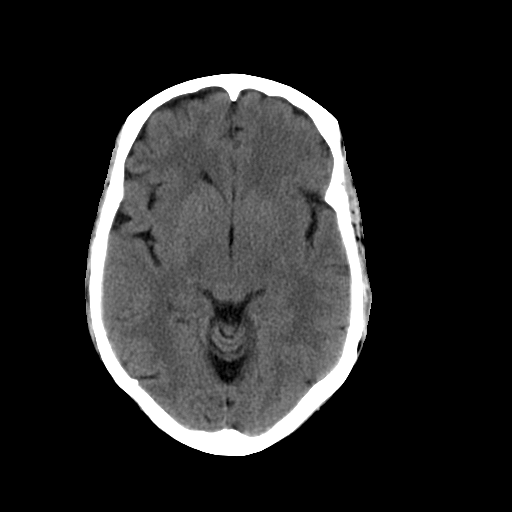
[im 14/29  brain]
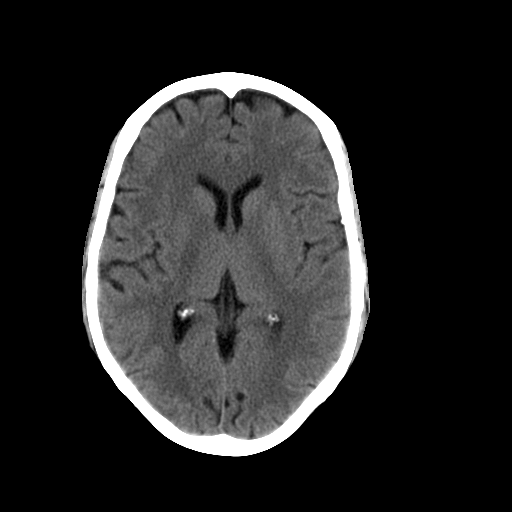
[im 16/29  brain]
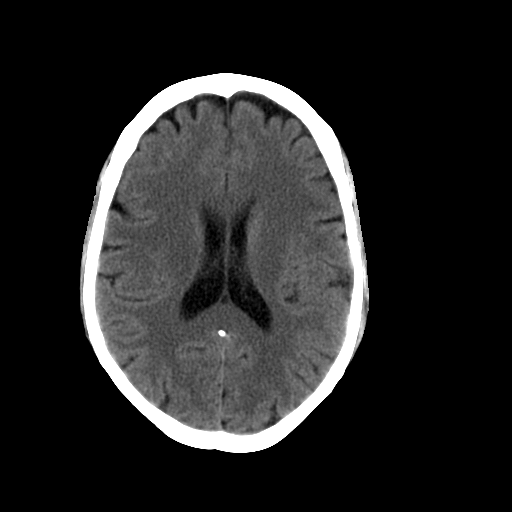
[im 16/29  bone]
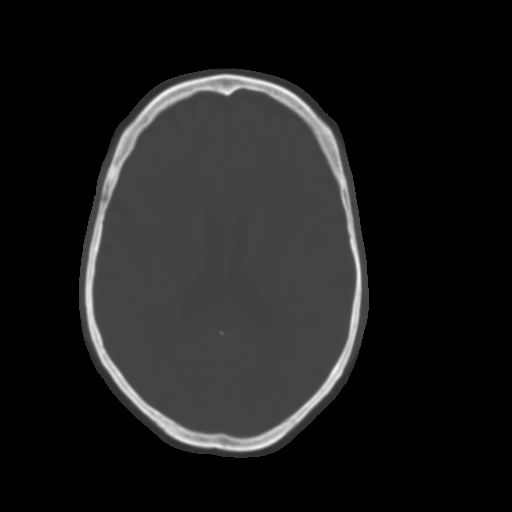
[im 18/29  brain]
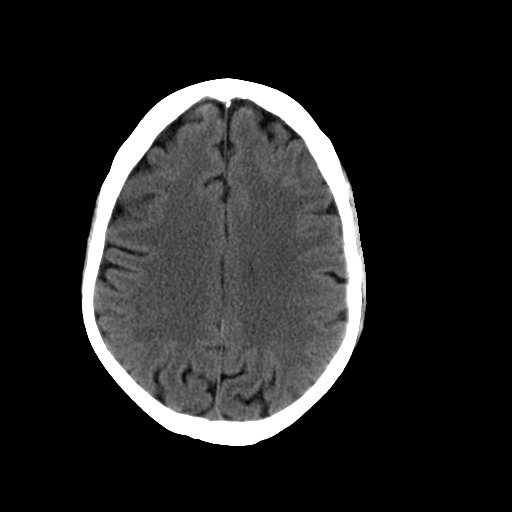
[im 19/29  brain]
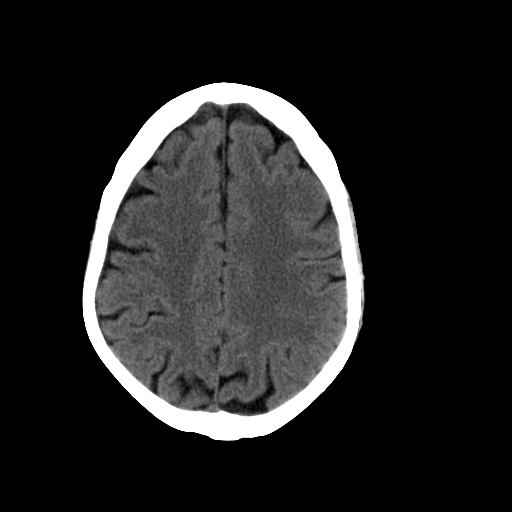
[im 21/29  brain]
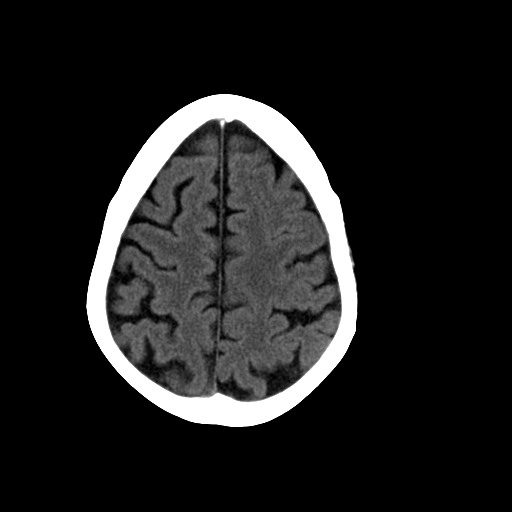
[im 23/29  brain]
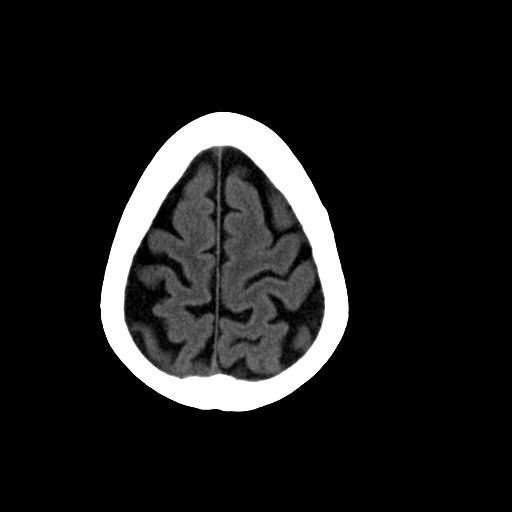
[im 23/29  bone]
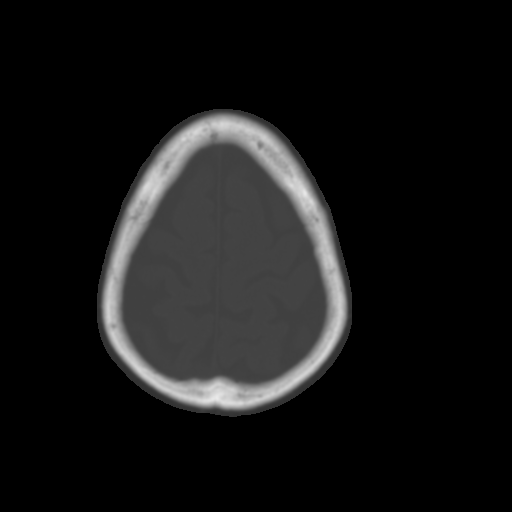
[im 24/29  brain]
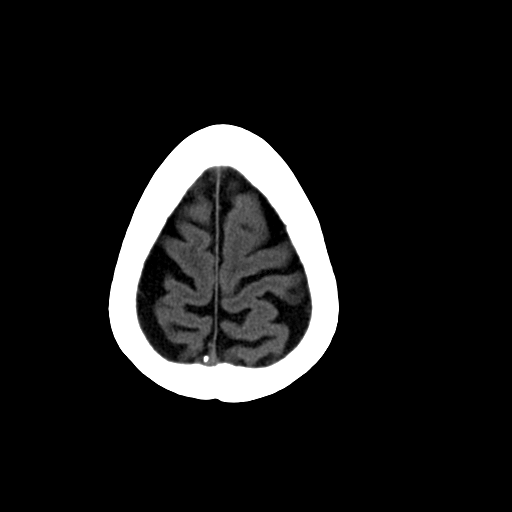
[im 26/29  brain]
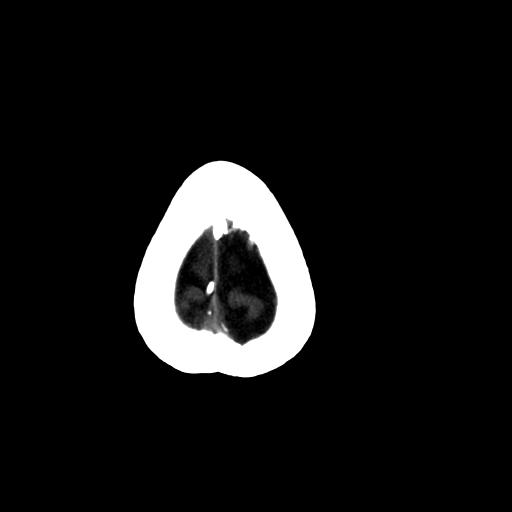
[im 28/29  brain]
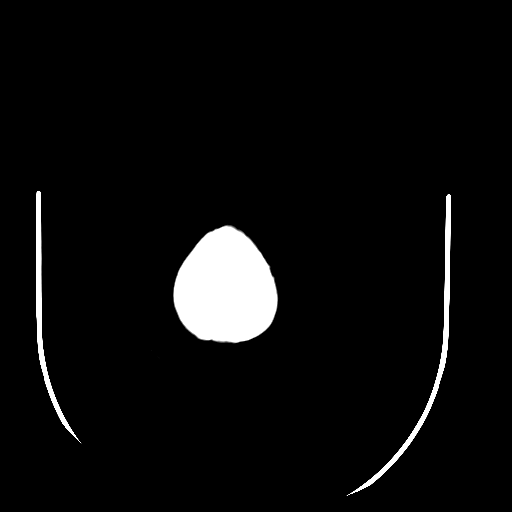

[16 of 29 positions shown; findings below may reference images not displayed]

FINDINGS: No definite acute infarction, mass lesion, hemorrhage, herniation, hydrocephalus, midline shift, or extra-axial fluid collection.   Gray and white matter differentiation is maintained.  The ventricles are symmetric and midline.  The cisterns are patent.  Remote left posterior cerebellar infarct is noted.
IMPRESSION: 1.  No acute intracranial finding.
2.  Remote posterior left cerebellar infarct. 
Note:  Acute infarction by CT may not be visible in initial 24 hours.

## 2008-09-14 ENCOUNTER — Ambulatory Visit: Payer: Self-pay | Admitting: Hematology & Oncology

## 2008-09-22 ENCOUNTER — Ambulatory Visit: Payer: Self-pay | Admitting: Hematology & Oncology

## 2008-09-23 LAB — FERRITIN: Ferritin: 245 ng/mL (ref 10–291)

## 2008-09-23 LAB — CBC WITH DIFFERENTIAL (CANCER CENTER ONLY)
BASO#: 0 10*3/uL (ref 0.0–0.2)
EOS%: 4.5 % (ref 0.0–7.0)
HGB: 10 g/dL — ABNORMAL LOW (ref 11.6–15.9)
LYMPH#: 1.2 10*3/uL (ref 0.9–3.3)
MCH: 25.4 pg — ABNORMAL LOW (ref 26.0–34.0)
MCHC: 30.1 g/dL — ABNORMAL LOW (ref 32.0–36.0)
MONO%: 8.5 % (ref 0.0–13.0)
NEUT#: 3.6 10*3/uL (ref 1.5–6.5)
Platelets: 334 10*3/uL (ref 145–400)
RBC: 3.95 10*6/uL (ref 3.70–5.32)

## 2008-09-30 ENCOUNTER — Ambulatory Visit: Payer: Self-pay | Admitting: Hematology & Oncology

## 2008-09-30 LAB — CBC WITH DIFFERENTIAL/PLATELET
BASO%: 0.6 % (ref 0.0–2.0)
EOS%: 3.8 % (ref 0.0–7.0)
HCT: 29.9 % — ABNORMAL LOW (ref 34.8–46.6)
MCH: 26 pg (ref 25.1–34.0)
MCHC: 30.1 g/dL — ABNORMAL LOW (ref 31.5–36.0)
MCV: 86.4 fL (ref 79.5–101.0)
MONO%: 9.1 % (ref 0.0–14.0)
NEUT%: 65.4 % (ref 38.4–76.8)
RDW: 22.5 % — ABNORMAL HIGH (ref 11.2–14.5)
lymph#: 1.3 10*3/uL (ref 0.9–3.3)

## 2008-09-30 LAB — FERRITIN: Ferritin: 101 ng/mL (ref 10–291)

## 2008-10-07 ENCOUNTER — Observation Stay (HOSPITAL_COMMUNITY): Admission: EM | Admit: 2008-10-07 | Discharge: 2008-10-07 | Payer: Self-pay | Admitting: Emergency Medicine

## 2008-10-27 ENCOUNTER — Ambulatory Visit: Payer: Self-pay | Admitting: Hematology & Oncology

## 2008-11-01 ENCOUNTER — Emergency Department (HOSPITAL_COMMUNITY): Admission: EM | Admit: 2008-11-01 | Discharge: 2008-11-02 | Payer: Self-pay | Admitting: Emergency Medicine

## 2008-11-02 LAB — CBC WITH DIFFERENTIAL (CANCER CENTER ONLY)
BASO#: 0 10*3/uL (ref 0.0–0.2)
BASO%: 0.4 % (ref 0.0–2.0)
EOS%: 5.3 % (ref 0.0–7.0)
LYMPH#: 1.6 10*3/uL (ref 0.9–3.3)
MCH: 25.1 pg — ABNORMAL LOW (ref 26.0–34.0)
MCHC: 30.5 g/dL — ABNORMAL LOW (ref 32.0–36.0)
MONO%: 10.2 % (ref 0.0–13.0)
NEUT#: 2.5 10*3/uL (ref 1.5–6.5)
RDW: 17.4 % — ABNORMAL HIGH (ref 10.5–14.6)

## 2008-11-02 LAB — FERRITIN: Ferritin: 18 ng/mL (ref 10–291)

## 2008-11-02 LAB — RETICULOCYTES (CHCC)
RBC.: 3.81 MIL/uL — ABNORMAL LOW (ref 3.87–5.11)
Retic Ct Pct: 1.9 % (ref 0.4–3.1)

## 2008-12-08 ENCOUNTER — Ambulatory Visit: Payer: Self-pay | Admitting: Hematology & Oncology

## 2008-12-09 LAB — CBC WITH DIFFERENTIAL (CANCER CENTER ONLY)
BASO#: 0 10*3/uL (ref 0.0–0.2)
EOS%: 3.9 % (ref 0.0–7.0)
HCT: 31.4 % — ABNORMAL LOW (ref 34.8–46.6)
HGB: 9.8 g/dL — ABNORMAL LOW (ref 11.6–15.9)
LYMPH#: 0.9 10*3/uL (ref 0.9–3.3)
LYMPH%: 24.5 % (ref 14.0–48.0)
MCHC: 31.1 g/dL — ABNORMAL LOW (ref 32.0–36.0)
MCV: 81 fL (ref 81–101)
MONO#: 0.3 10*3/uL (ref 0.1–0.9)
NEUT%: 62.2 % (ref 39.6–80.0)

## 2008-12-09 LAB — TECHNOLOGIST REVIEW CHCC SATELLITE

## 2008-12-09 LAB — CHCC SATELLITE - SMEAR

## 2008-12-09 LAB — RETICULOCYTES (CHCC): Retic Ct Pct: 1.9 % (ref 0.4–3.1)

## 2008-12-09 LAB — FERRITIN: Ferritin: 28 ng/mL (ref 10–291)

## 2009-01-04 ENCOUNTER — Ambulatory Visit: Payer: Self-pay | Admitting: Hematology

## 2009-01-04 ENCOUNTER — Emergency Department (HOSPITAL_COMMUNITY): Admission: EM | Admit: 2009-01-04 | Discharge: 2009-01-04 | Payer: Self-pay | Admitting: Family Medicine

## 2009-01-04 LAB — CBC WITH DIFFERENTIAL/PLATELET
Basophils Absolute: 0.1 10*3/uL (ref 0.0–0.1)
EOS%: 4.6 % (ref 0.0–7.0)
HCT: 27.8 % — ABNORMAL LOW (ref 34.8–46.6)
HGB: 8 g/dL — ABNORMAL LOW (ref 11.6–15.9)
LYMPH%: 20.5 % (ref 14.0–49.7)
MCH: 22.3 pg — ABNORMAL LOW (ref 25.1–34.0)
MCV: 77.4 fL — ABNORMAL LOW (ref 79.5–101.0)
MONO%: 9.8 % (ref 0.0–14.0)
NEUT%: 64 % (ref 38.4–76.8)
Platelets: 423 10*3/uL — ABNORMAL HIGH (ref 145–400)
RDW: 19.5 % — ABNORMAL HIGH (ref 11.2–14.5)

## 2009-01-04 LAB — COMPREHENSIVE METABOLIC PANEL
AST: 19 U/L (ref 0–37)
Alkaline Phosphatase: 124 U/L — ABNORMAL HIGH (ref 39–117)
BUN: 6 mg/dL (ref 6–23)
Creatinine, Ser: 0.71 mg/dL (ref 0.40–1.20)

## 2009-01-11 ENCOUNTER — Ambulatory Visit: Payer: Self-pay | Admitting: Hematology & Oncology

## 2009-02-16 ENCOUNTER — Ambulatory Visit: Payer: Self-pay | Admitting: Hematology & Oncology

## 2009-02-17 LAB — CBC WITH DIFFERENTIAL (CANCER CENTER ONLY)
BASO#: 0 10*3/uL (ref 0.0–0.2)
BASO%: 0.3 % (ref 0.0–2.0)
Eosinophils Absolute: 0.2 10*3/uL (ref 0.0–0.5)
HCT: 28.9 % — ABNORMAL LOW (ref 34.8–46.6)
HGB: 8.8 g/dL — ABNORMAL LOW (ref 11.6–15.9)
LYMPH#: 1.2 10*3/uL (ref 0.9–3.3)
MCV: 76 fL — ABNORMAL LOW (ref 81–101)
MONO#: 0.4 10*3/uL (ref 0.1–0.9)
NEUT%: 64.4 % (ref 39.6–80.0)
RBC: 3.82 10*6/uL (ref 3.70–5.32)
RDW: 20.1 % — ABNORMAL HIGH (ref 10.5–14.6)
WBC: 5.1 10*3/uL (ref 3.9–10.0)

## 2009-02-18 LAB — RETICULOCYTES (CHCC)
ABS Retic: 72.4 10*3/uL (ref 19.0–186.0)
Retic Ct Pct: 2 % (ref 0.4–3.1)

## 2009-02-18 LAB — FERRITIN: Ferritin: 11 ng/mL (ref 10–291)

## 2009-02-18 LAB — VITAMIN D 25 HYDROXY (VIT D DEFICIENCY, FRACTURES): Vit D, 25-Hydroxy: 6 ng/mL — ABNORMAL LOW (ref 30–89)

## 2009-04-05 ENCOUNTER — Ambulatory Visit: Payer: Self-pay | Admitting: Hematology & Oncology

## 2009-11-11 IMAGING — CR DG WRIST COMPLETE 3+V*R*
4 series · 4 of 4 positions shown · non-contrast
Comparison: None

CLINICAL DATA: Wrist injury

RIGHT WRIST - COMPLETE 3+ VIEW

[view not recorded (1 of 4)]
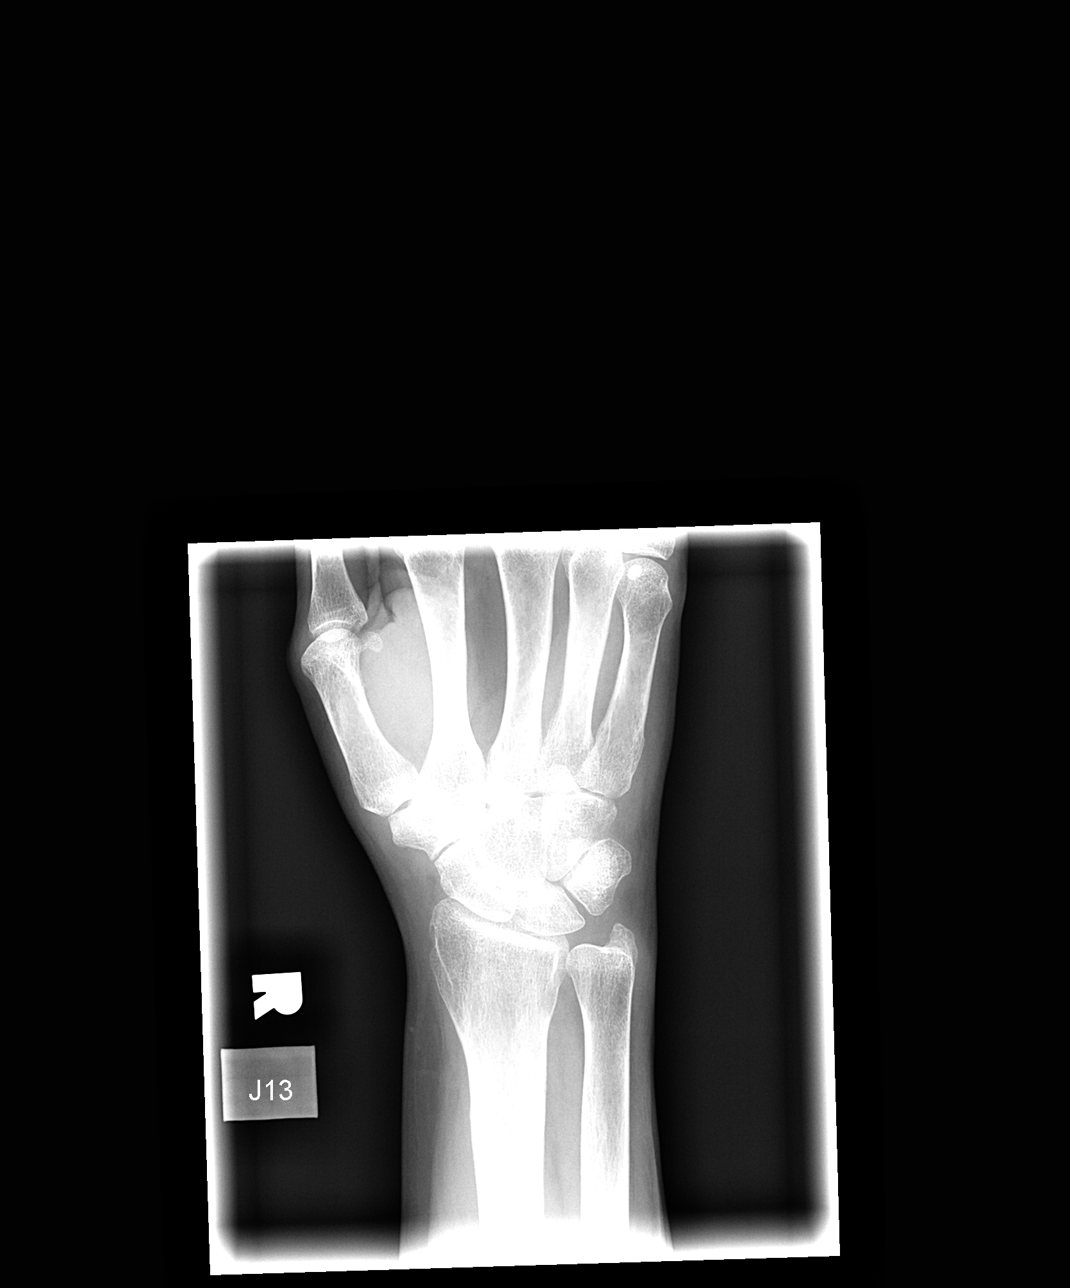

[view not recorded (2 of 4)]
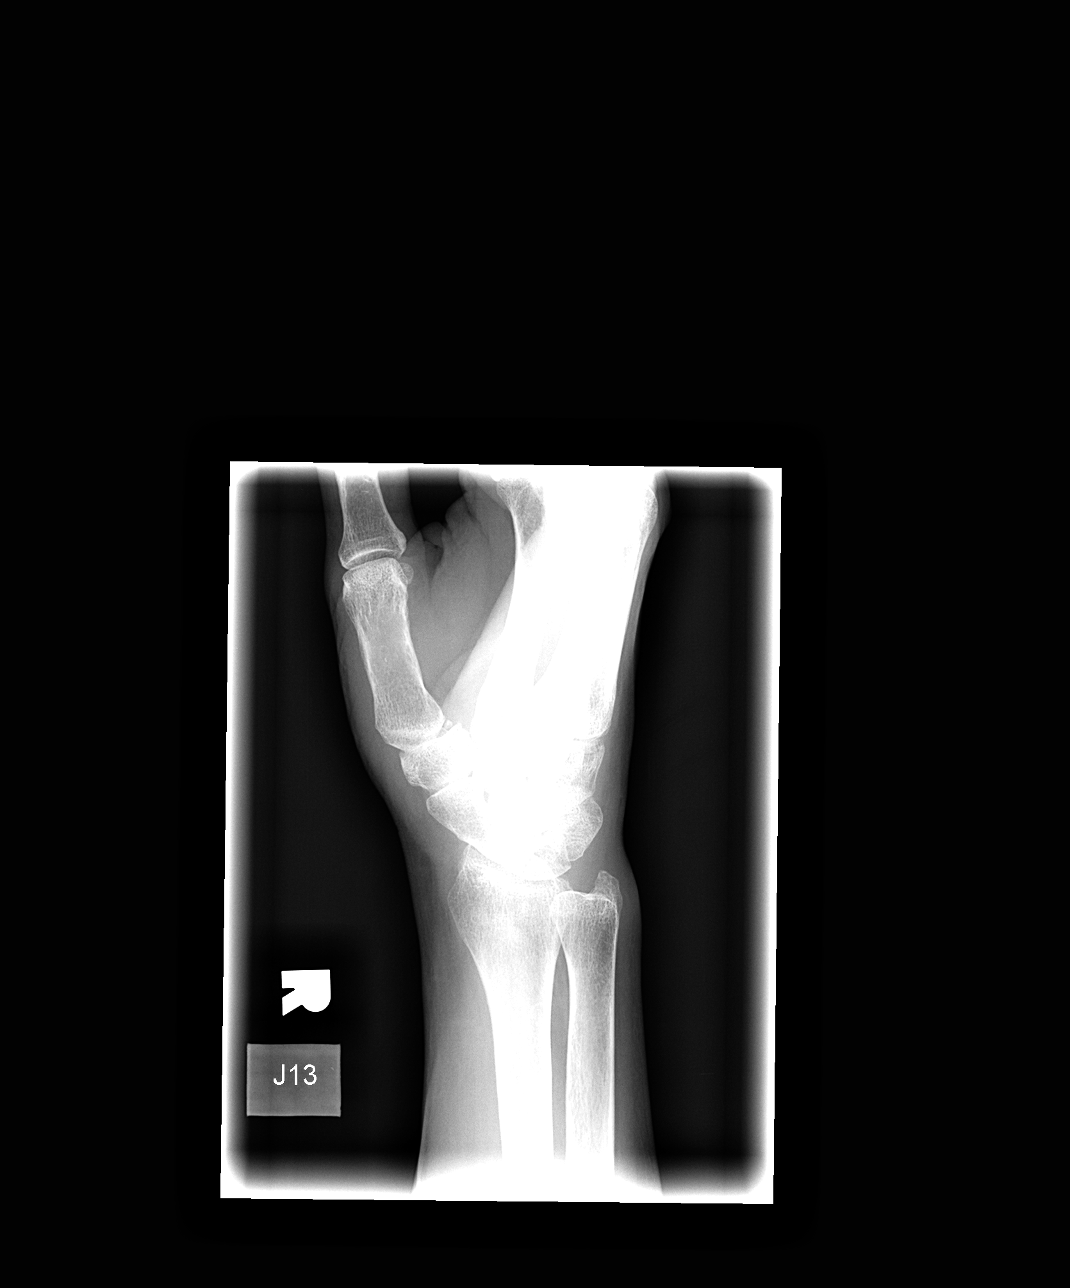

[view not recorded (3 of 4)]
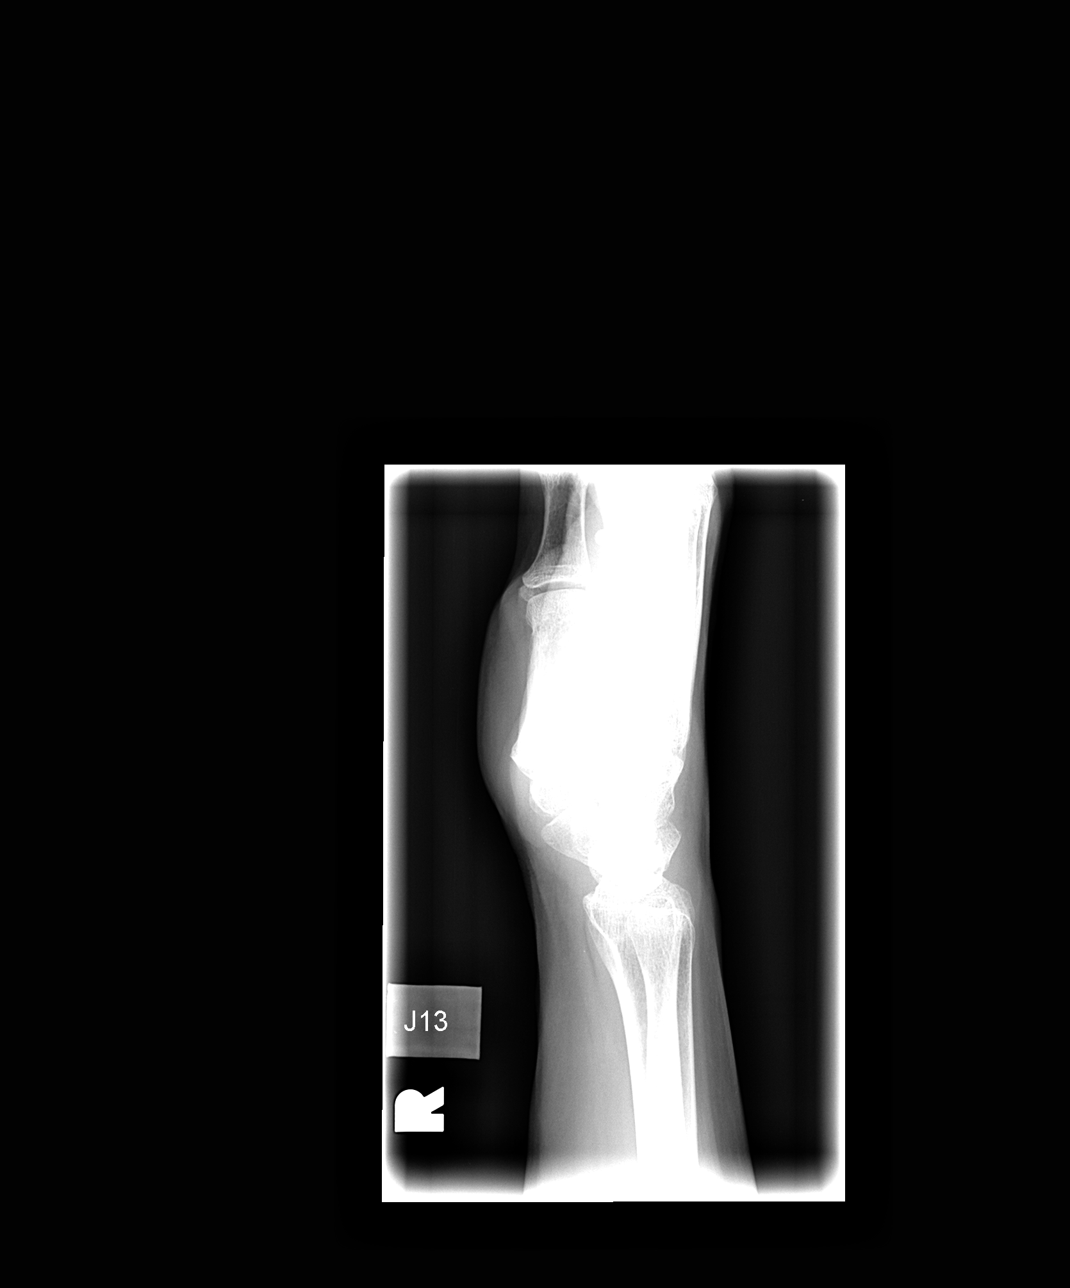

[view not recorded (4 of 4)]
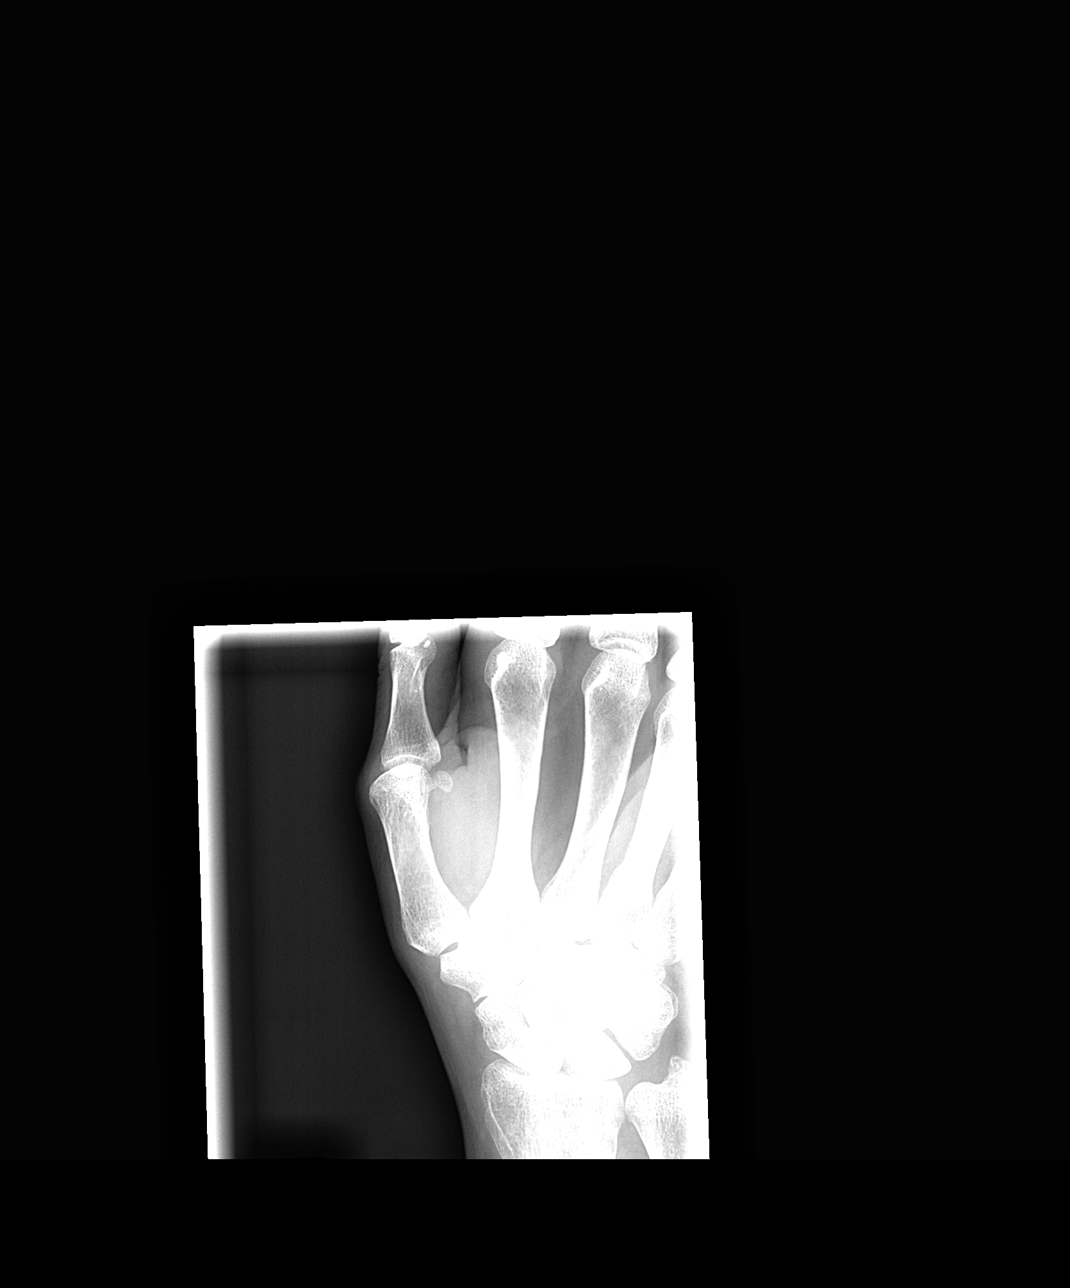

[4 of 4 positions shown; findings below may reference images not displayed]

FINDINGS: The joint spaces are maintained.  No fractures are seen.
Possible cyst in the navicular bone.
IMPRESSION: 1.  No acute bony findings.

## 2010-04-01 ENCOUNTER — Encounter: Payer: Self-pay | Admitting: Hematology & Oncology

## 2010-06-17 LAB — TROPONIN I: Troponin I: 0.02 ng/mL (ref 0.00–0.06)

## 2010-06-17 LAB — BASIC METABOLIC PANEL
CO2: 26 mEq/L (ref 19–32)
CO2: 28 mEq/L (ref 19–32)
Calcium: 10.1 mg/dL (ref 8.4–10.5)
Chloride: 110 mEq/L (ref 96–112)
GFR calc Af Amer: >60 mL/min (ref >60)
GFR calc non Af Amer: >60 mL/min (ref >60)
Glucose, Bld: 120 mg/dL — ABNORMAL HIGH (ref 70–99)
Potassium: 3.9 mEq/L (ref 3.5–5.1)
Sodium: 141 mEq/L (ref 135–145)
Sodium: 144 mEq/L (ref 135–145)

## 2010-06-17 LAB — COMPREHENSIVE METABOLIC PANEL
AST: 18 U/L (ref 0–37)
Albumin: 3 g/dL — ABNORMAL LOW (ref 3.5–5.2)
BUN: 10 mg/dL (ref 6–23)
BUN: 7 mg/dL (ref 6–23)
CO2: 24 mEq/L (ref 19–32)
Chloride: 110 mEq/L (ref 96–112)
Creatinine, Ser: 0.66 mg/dL (ref 0.4–1.2)
Creatinine, Ser: 0.64 mg/dL (ref 0.4–1.2)
GFR calc Af Amer: >60 mL/min (ref >60)
GFR calc non Af Amer: >60 mL/min (ref >60)
Potassium: 3.5 mEq/L (ref 3.5–5.1)
Total Bilirubin: 0.5 mg/dL (ref 0.3–1.2)
Total Protein: 6.1 g/dL (ref 6.0–8.3)

## 2010-06-17 LAB — CBC
HCT: 31.8 % — ABNORMAL LOW (ref 36.0–46.0)
HCT: 26.6 % — ABNORMAL LOW (ref 36.0–46.0)
HCT: 31.2 % — ABNORMAL LOW (ref 36.0–46.0)
Hemoglobin: 10.1 g/dL — ABNORMAL LOW (ref 12.0–15.0)
Hemoglobin: 8.2 g/dL — ABNORMAL LOW (ref 12.0–15.0)
Hemoglobin: 8.3 g/dL — ABNORMAL LOW (ref 12.0–15.0)
MCHC: 31 g/dL (ref 30.0–36.0)
MCHC: 31.2 g/dL (ref 30.0–36.0)
MCHC: 32.5 g/dL (ref 30.0–36.0)
MCV: 89.5 fL (ref 78.0–100.0)
MCV: 80 fL (ref 78.0–100.0)
Platelets: 360 10*3/uL (ref 150–400)
RBC: 3.56 MIL/uL — ABNORMAL LOW (ref 3.87–5.11)
RBC: 3.33 MIL/uL — ABNORMAL LOW (ref 3.87–5.11)
RBC: 3.33 MIL/uL — ABNORMAL LOW (ref 3.87–5.11)
RDW: 18.2 % — ABNORMAL HIGH (ref 11.5–15.5)
RDW: 19.2 % — ABNORMAL HIGH (ref 11.5–15.5)
WBC: 6.5 10*3/uL (ref 4.0–10.5)
WBC: 4.9 10*3/uL (ref 4.0–10.5)

## 2010-06-17 LAB — TSH: TSH: 1.116 u[IU]/mL (ref 0.350–4.500)

## 2010-06-17 LAB — LIPID PANEL
HDL: 42 mg/dL (ref >39)
LDL Cholesterol: 97 mg/dL (ref 0–99)
Triglycerides: 74 mg/dL (ref <150)

## 2010-06-17 LAB — URINE MICROSCOPIC-ADD ON

## 2010-06-17 LAB — CROSSMATCH
ABO/RH(D): A POS
Antibody Screen: NEGATIVE

## 2010-06-17 LAB — URINALYSIS, ROUTINE W REFLEX MICROSCOPIC
Bilirubin Urine: NEGATIVE
Ketones, ur: NEGATIVE mg/dL
Nitrite: NEGATIVE
Protein, ur: NEGATIVE mg/dL
Urobilinogen, UA: 0.2 mg/dL (ref 0.0–1.0)
pH: 7 (ref 5.0–8.0)

## 2010-06-17 LAB — HEMOGLOBIN A1C: Mean Plasma Glucose: 94 mg/dL

## 2010-06-17 LAB — POCT I-STAT, CHEM 8
Glucose, Bld: 91 mg/dL (ref 70–99)
HCT: 31 % — ABNORMAL LOW (ref 36.0–46.0)
Hemoglobin: 10.5 g/dL — ABNORMAL LOW (ref 12.0–15.0)
Potassium: 3.8 mEq/L (ref 3.5–5.1)
Sodium: 142 mEq/L (ref 135–145)

## 2010-06-17 LAB — CK TOTAL AND CKMB (NOT AT ARMC)
CK, MB: 3.1 ng/mL (ref 0.3–4.0)
CK, MB: 3.5 ng/mL (ref 0.3–4.0)
Relative Index: 3.3 — ABNORMAL HIGH (ref 0.0–2.5)
Total CK: 82 U/L (ref 7–177)

## 2010-06-17 LAB — HEMOCCULT GUIAC POC 1CARD (OFFICE): Fecal Occult Bld: POSITIVE

## 2010-06-17 LAB — POCT CARDIAC MARKERS
CKMB, poc: 1.5 ng/mL (ref 1.0–8.0)
CKMB, poc: 2.1 ng/mL (ref 1.0–8.0)
Myoglobin, poc: 71.8 ng/mL (ref 12–200)

## 2010-06-17 LAB — DIFFERENTIAL
Basophils Absolute: 0 10*3/uL (ref 0.0–0.1)
Lymphocytes Relative: 6 % — ABNORMAL LOW (ref 12–46)
Monocytes Relative: 2 % — ABNORMAL LOW (ref 3–12)

## 2010-06-17 LAB — D-DIMER, QUANTITATIVE: D-Dimer, Quant: <0.22 ug/mL-FEU (ref 0.00–0.48)

## 2010-06-17 LAB — RETICULOCYTES: Retic Count, Absolute: 84.8 10*3/uL (ref 19.0–186.0)

## 2010-06-17 LAB — LIPASE, BLOOD: Lipase: 19 U/L (ref 11–59)

## 2010-07-24 NOTE — Discharge Summary (Signed)
[name], [name]              ACCOUNT NO.:  [id number]   MEDICAL RECORD NO.:  [id number]          PATIENT TYPE:  OIB   LOCATION:  3104                         FACILITY:  [facility]   PHYSICIAN:  Sanjeev K. 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   HISTORY OF PRESENT ILLNESS:  This is a very pleasant 65 year old female  who was self-referred to Dr. [name] for a history of cerebral  aneurysm. The patient's daughter is also a patient of Dr. [name].  At one of the daughter's appointments, the patient requested to be seen  by Dr. [name] as well.  She was seen in consultation on October 31, 2006.  The patient has a history of Osler Weber Rendu syndrome.  She had  a cerebral aneurysm treated with the placement of a Neuroform stent at  [facility] in 2005.  The physician who performed the  procedure is no longer at [facility].  In February 2008, the patient was seen  at [facility] in the Emergency Department for left-sided  numbness.  There was concern that she may have been having a CVA or TIA.  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  The patient was seen by Dr. [name].  She was  treated with Keppra for possible seizure activity.   The patient saw Dr. [name] in consultation on October 31, 2006, for  further treatment and recommendations regarding her aneurysm.  There was  a general discussion regarding aneurysms which included treatment  options as well as the risks and benefits of treatment.  The patient  decided that she wanted to proceed with a cerebral angiogram and  possible further intervention if indicated.  She was admitted to  [facility] on November 25, 2006, for those procedures.  The patient  has been experiencing intermittent left-sided numbness as well as  headache behind her left eye.   PAST MEDICAL HISTORY:  Significant for a left cerebellar CVA.  She has a  history of breast cancer.  She is status post lumpectomy.  She has a  history of a cerebral aneurysm treated by stenting at [facility] in 2005.  She has a history of Osler Weber Rendu  syndrome.  She is being treated for seizures by Dr. [name].  She has a  history of a pulmonary fistula repair at [facility]  in 1984.   SURGICAL HISTORY:  Significant for a right lumpectomy, the above-noted  pulmonary fistula repair, and left shoulder surgery.  She denies any  previous problems with anesthesia.   ALLERGIES:  THE PATIENT IS INTOLERANT TO ASPIRIN SECONDARY TO BLEEDING.   MEDICATIONS ON ADMISSION:  Included Keppra, Protonix, and Vicodin.  She  was started on Plavix on the day of admission  in anticipation of a  possible stent placement.   SOCIAL HISTORY:  The patient is divorced.  She has two children.  She  lives alone in [place].  She denies alcohol or tobacco use.  She is  currently unemployed.  She is attending school.   FAMILY HISTORY:  The patient's parents' medical history is unknown to  her.   HOSPITAL COURSE:  As noted, this patient was admitted to [facility] to undergo a cerebral angiogram and possible further treatment  of a 3.2-mm left periophthalmic artery aneurysm noted on MRI/MRA  performed in February 2008.  The patient was started on Plavix in  anticipation of possible stenting.  She had a cerebral angiogram  performed by Dr. [name] on the morning of admission.  This did  confirm the presence of the aneurysm.  The decision was made to proceed  with placement of a stent to redirect blood flow.  The stent was placed  under general anesthesia by Dr. [name].  The  patient tolerated the  procedure well.  She was admitted to the neuro intensive care unit for  further monitoring.   The patient was maintained on IV heparin following the procedure.  She  did have some nausea and vomiting during the night which apparently  contained some evidence of blood.  The IV heparin was discontinued.  The  patient was given Phenergan for her nausea and vomiting.  The nausea,  for the most part, had resolved by the following morning.   The patient has remained neurologically intact.  The plan is to remove  the right femoral sheath.  If she continues to remain stable, she will  be discharged later this evening.   The patient was noted to be mildly anemic on the day following her  intervention.  She was also noted to have a tachycardia throughout the  night and the morning following the procedure.  The etiology of the  tachycardia was uncertain and may be due to the anemia.  However, Dr.  [name] was concerned that she may have some hyperthyroidism;  although, she was not tachycardiac at the time of admission.   LABORATORY DATA:  A basic metabolic panel on the day of discharge  revealed a BUN 8, creatinine 0.64, glucose 126, GFR was greater than 60,  potassium was 3.7.  A CBC on the day of discharge revealed hemoglobin  9.2, hematocrit 28.6, WBCs 7.9, platelets 392,000.  A CBC on September  11 had revealed hemoglobin 10.6, hematocrit 33, WBCs 4.6, platelets  365,000.   DISCHARGE INSTRUCTIONS:  The patient was told to resume her medications  as taken prior to admission.  She was also to remain on Plavix 75 mg  daily for at least two weeks, possibly longer.  She was to resume her  previous diet.  She was given instructions regarding wound care.  She  was told not to drive or do anything strenuous for at least two weeks.  She was told that she could remain out of school for the next two weeks.   A follow-up angiogram was recommended in three months.  The patient  will  need to follow up with her primary care physician for further evaluation  of anemia and possibly tachycardia if this issue continues.  She will  follow up with Dr. [name] in approximately two weeks.   DISCHARGE DIAGNOSES:  1. 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  Please  see Dr. [name] dictated note for      full details.  2. History of Osler Weber Rendu syndrome.  3. History of a cerebral aneurysm treated with a Neuroform stent at      [facility] in 2005.  4. Intermittent left-sided numbness felt to be due to seizures,      currently being treated with Keppra.  5. History of breast cancer, status post lumpectomy.  6. Anemia at time of admission.  A follow-up CBC is currently pending.  7. Status post multiple surgeries as noted above.  8. History of a pulmonary fistula repair at [facility] in 1984.  9. Aspirin intolerance secondary to bleeding.  10.Tachycardia of uncertain etiology.      Delton See, P.A.    ______________________________  Grandville Silos. [name], M.D.    DR/MEDQ  D:  11/26/2006  T:  11/26/2006  Job:  [id number]   cc:   Dr. Sela Hua, M.D.

## 2010-07-24 NOTE — H&P (Signed)
[name], CADLE              ACCOUNT NO.:  [id number]   MEDICAL RECORD NO.:  [id number]          PATIENT TYPE:  OIB   LOCATION:  3172                         FACILITY:  [facility]   PHYSICIAN:  [name], M.D.DATE OF BIRTH:  [birth year]   DATE OF ADMISSION:  11/25/2006  DATE OF DISCHARGE:                              HISTORY & PHYSICAL   CHIEF COMPLAINT:  History of cerebral aneurysms with Osler-Weber-Redue  syndrome.   HISTORY OF PRESENT ILLNESS:  This is a very pleasant 65 year old female  who was self-referred to Dr. Corliss Shields for a history of cerebral  aneurysms.  The patient's daughter is also a patient of Dr. Fatima Shields.  At one of the daughter's appointments, the patient requested to be seen  by Dr. Corliss Shields as well.  She was seen in consultation on October 31, 2006.  The patient has a history of Osler-Weber-Rendue syndrome.  She  had a cerebral aneurysm treated with Neuroform stenting at [facility] in 2005.  The physician who performed the  procedure is no longer at [facility].   In February 2008, the patient was seen at [facility] in the  emergency department for left-sided numbness.  There was concern that  she may be having a CVA or a TIA.  An MRI/MRA was performed that ruled  out a CVA, although there was a left periophthalmic artery aneurysm  noted which measured approximately 3.2 mm.   The patient was seen by Dr. Pearlean Shields.  She has been treated with Keppra for  possible seizure activity.   As noted, the patient was seen in consultation by Dr. Corliss Shields on  October 31, 2006 for further treatment recommendations regarding her  aneurysm.  Aneurysms were discussed in detail along with treatment  options as well as the risks and benefits.  The patient had been  experiencing intermittent left-sided numbness as well as headache pain  behind her left eye.  She was admitted to [facility] on  November 25, 2006 for a repeat cerebral  angiogram and possible  intervention.   PAST MEDICAL HISTORY:  1. A left cerebellar CVA.  2. A history of breast cancer, status post lumpectomy.  3. She has a history of a cerebral aneurysm treated by stenting at      [facility] in 2005.  She has a history of Osler-      Weber-Rendue syndrome.  She is currently being treated for seizures      by Dr. Pearlean Shields.   PAST SURGICAL HISTORY:  1. A right lumpectomy.  2. She had a pulmonary fistula repaired at [facility] in 1984.  3. She has had left shoulder surgery.   She denies any previous problems with anesthesia.   ALLERGIES:  The patient is INTOLERANT TO ASPIRIN secondary to bleeding.   MEDICATIONS:  Keppra, Protonix and Vicodin.   SOCIAL HISTORY:  The patient is divorced.  She has 2 children.  She  lives alone in [place].  She denies alcohol or tobacco use.  She is  currently unemployed.  FAMILY HISTORY:  The patient's parents' medical history is unknown to  her.   REVIEW OF SYSTEMS:  Completely negative except for ongoing headaches,  some blurred vision at times as well as occasional dizziness.  She  reports some urinary frequency.  She reports frequent nosebleeds as well  as other bleeding problems.   LABORATORY DATA:  INR 1, PTT 34.  CBC reveals hemoglobin 10.6,  hematocrit 33, WBC 4.6000, platelets 365, 000, BUN 6, creatinine 0.68.  GFR is greater than 60.  Potassium 4, glucose 114.   PHYSICAL EXAMINATION:  A pleasant 65 year old African-American female in  no acute distress.  VITAL SIGNS:  Blood pressure 119/92, pulse 88, respirations 18,  temperature 96.9, oxygen saturation 98% on room air.  HEENT:  Unremarkable.  NECK:  No bruits.  HEART:  Regular rate and rhythm with a soft systolic murmur at the left  sternal border.  LUNGS:  Clear.  ABDOMEN:  Obese, nontender.  EXTREMITIES:  Reveal pulses to be intact without edema.  Her airway is rated at a 3.  Her AFA scale is  rated at a 3.  NEUROLOGIC EXAM:  Mental status:  The patient is alert and oriented, and  follows commands.  Cranial nerves II-XII are grossly intact.  Sensation  is intact to light touch.  Motor strength is 4/5 to 5/5 throughout.  Cerebellar testing is intact.   IMPRESSION:  1. Left periophthalmic artery aneurysm, approximately 3.2 mm by      MRI/MRA.  2. History of Osler-Weber-Rendue syndrome.  3. History of cerebral aneurysm treated with a Neuroform stent at [facility] in 2005.  4. Intermittent left-sided numbness felt to be due to seizures,      currently being treated with Keppra.  5. History of breast cancer status post lumpectomy.  6. Anemia.  7. Status post multiple surgeries as noted above.  8. Pulmonary fistula repaired at [facility] in      1984.  9. History of aspirin intolerance secondary to bleeding.   PLAN:  As noted, the patient will undergo a repeat cerebral angiogram  today to further evaluate her cerebral aneurysm with possible  endovascular coiling and/or stenting, if felt to be safe and indicated.      [name], P.A.    ______________________________  [name]. [name], M.D.    DR/MEDQ  D:  11/25/2006  T:  11/25/2006  Job:  [id number]   cc:   Dr. Sela Hua, MD

## 2010-07-24 NOTE — Consult Note (Signed)
NAMEHAEDYN, BREAU              ACCOUNT NO.:  [id number]   MEDICAL RECORD NO.:  [id number]          PATIENT TYPE:  OUT   LOCATION:  XRAY                         FACILITY:  [facility]   PHYSICIAN:  Sanjeev K. Deveshwar, M.D.DATE OF BIRTH:  Jun 10, 1945   DATE OF CONSULTATION:  10/31/2006  DATE OF DISCHARGE:  10/31/2006                                 CONSULTATION   CHIEF COMPLAINT:  Cerebral aneurysm with a history of Osler-Weber-Rendu  syndrome.   HISTORY OF THE PRESENT ILLNESS:  This is a 65 year old female who is  self referred for a history of cerebral aneurysms.  The patient's  daughter is followed by Dr. [name].  At one of the daughter's  appointments the patient requested to be seen by Dr. [name] as well.  She presents today for that appointment.   The patient has a history of Osler-Weber-Rendu syndrome.  She had a  cerebral aneurysm coiled at  [facility] in 2005.  We  do not have the details regarding that intervention.  Apparently the  physician who treated Ms. Floris is no longer at [facility].  In February 2008  the patient was seen here at [facility] in the emergency  department for left-sided numbness.  There was concern that she may have  been having a CVA or a TIA.  MRI and MRA were performed that ruled out a  stroke; however, there was a question of a left periophthalmic artery  aneurysm, which measured approximately 3.2 mm.  The patient was seen by  Dr. [name] and he was concerned that the patient may be having a stroke.  She was started on Keppra.  The patient followed up with Dr. [name], she  believes, in April or May.  At that time she was still having symptoms  and her Keppra was increased.  The patient reports that she still has  intermittent left-sided numbness as well as a headache type pain behind  her left eye.   PAST MEDICAL HISTORY:  The past medical history is significant for:  1. Remote left cerebellar CVA.  2. The patient has a  history of breast cancer and is status post      lumpectomy.  3. As noted, she previously had a cerebral aneurysm coiled at [facility] in 2005.  4. The patient has a history of Osler-Weber-Rendu syndrome.  5. The patient is felt to have seizures, which cause intermittent left-      sided numbness.   PAST SURGICAL HISTORY:  The surgical history is significant for:  1. Right lumpectomy.  2. The patient had a pulmonary fistula repaired at [facility] in 1984.  3. The patient had left shoulder surgery; and, she reports no previous      problems with anesthesia.   ALLERGIES AND INTOLERANCES:  The patient is intolerant of ASPIRIN  SECONDARY TO BLEEDING.   MEDICATIONS:  Current medications include Keppra, Protonix and  hydrocodone as needed for back pain.   SOCIAL HISTORY:  The patient is divorced.  She has  two children.  She  lives in [place] and she lives alone.  She denies alcohol or tobacco  use.  She is currently unemployed.   FAMILY HISTORY:  The patient's mother's and father's histories are  unknown to the patient.   IMPRESSION AND PLAN:  As noted, the patient presented today for further  evaluation with a history of cerebral aneurysms and  abnormal MRI and  MRA performed in February 2008.  Dr. [name] reviewed the results of  these  studies with the patient.  He pointed out the area of concern.  He told her that he could not be sure that this was, in fact, an  aneurysm, and that a cerebral angiogram would be required for further  diagnosis.  Dr. [name] also recommended a cerebral angiogram to  follow up on the previously coiled aneurysm performed at [facility].   Cerebral aneurysm was discussed in detail as well as the risks and  benefits regarding treatment.  The cerebral angiogram was also discussed  including the risks and benefits.  At this point the patient would like  to proceed with  scheduling of the cerebral angiogram; and, she wants to  discuss further intervention after the results of the angiogram are  known.  We will try to schedule her angiogram for next week.      Delton See, P.A.    ______________________________  Grandville Silos. [name], M.D.    DR/MEDQ  D:  10/31/2006  T:  11/02/2006  Job:  [id number]   cc:   Pramod P. [name], MD  Fleet Contras, M.D.

## 2010-07-24 NOTE — Consult Note (Signed)
NAMEMAHATHI, POKORNEY       ACCOUNT NO.:  [id number]   MEDICAL RECORD NO.:  [id number]          PATIENT TYPE:  OUT   LOCATION:  VASC                         FACILITY:  [facility]   PHYSICIAN:  Sanjeev K. Deveshwar, M.D.DATE OF BIRTH:  06-07-1945   DATE OF CONSULTATION:  DATE OF DISCHARGE:                                 CONSULTATION   HISTORY:  This is a pleasant 65 year old female who had a cerebral  angiogram performed by Dr. [name] on March 20, 2007.  The patient  contacted [place] today stating that she began to have some right inguinal  groin pain approximately one week ago.  The pain appears to be at the  site of the previous angiogram.  We asked her to come to the hospital to  be evaluated.   The patient was examined.  She had good distal pulses.  There was no  bruit noted.  There was no edema or ecchymosis.  She did have point  tenderness right over the right femoral artery.  Again, there was no  bruit.  The patient states that the pain did tend to radiate around to  her side to her flank and back.   We have scheduled the patient for a Doppler that was performed today.  The Doppler was negative for any pseudoaneurysm or AV fistula.  The  patient was given instructions to take ibuprofen p.r.n. for pain and to  avoid any strenuous activity until she was feeling better.  There was no  charge for this consult today.      Delton See, P.A.    ______________________________  Grandville Silos. [name], M.D.    DR/MEDQ  D:  04/27/2007  T:  04/28/2007  Job:  [id number]   cc:   Fleet Contras, M.D.

## 2010-07-24 NOTE — Op Note (Signed)
NAMESAGA, BALTHAZAR        ACCOUNT NO.:  [id number]   MEDICAL RECORD NO.:  [id number]          PATIENT TYPE:  AMB   LOCATION:  DSC                          FACILITY:  [facility]   PHYSICIAN:  Christopher E. 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   OPERATION PERFORMED:  Split-thickness skin graft to left septum with  cautery of left inferior turbinate.   SURGEON:  Kristine Garbe. Ezzard Standing, MD   ANESTHESIA:  General LMA.   COMPLICATIONS:  None.   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  She has had  significantly more bleeding from the left side of the nose and because  of chronic problem, she is taken to operating room at this time for  split-thickness skin graft to the septum and we will plan cauterization  of the turbinates at the same time.   DESCRIPTION OF PROCEDURE:  After adequate general anesthesia with LMA,  the patient received 1 g Ancef IV preoperatively.  The nose was  examined.  She had very friable mucosa with easy bleeding from the  septum as well as the inferior middle turbinates anteriorly.  First, a  split-thickness skin graft was harvested from the left side measuring  6.16 thickness.  This was set aside in moist gauze to be used later.  Next, the nose was examined.  A bipolar submucosal cauterization was  performed on the left inferior turbinate and suction cautery was used to  cauterize the anterior portion of the left middle turbinate to help  reduce bleeding from these areas.  Next, an incision  was made along the  anterior portion of the septum and the portion of the septal mucosa that  bled easily that went back about to the just past anterior border of the  middle turbinate was removed.  After removal of this portion of the  tissue was sent to pathology.  The split-thickness skin graft was then  placed over the left side of the septum and secured anteriorly with  three 4-0 chromic sutures.  The split-thickness skin graft was then  rolled over the entire denuded septum on the left side.  Xeroform gauze  was placed over this followed by a Merocel sponge packing which was  hydrated with saline.  On the right side of the nose, a few of the more  prominent vessels were cauterized using suction cautery.  This completed  the procedure.  The split-thickness donor site on the left thigh was  dressed with Xeroform gauze and OpSite.  [name] was awoke from  anesthesia and transferred to the recovery room, postop doing well.   DISPOSITION:  [name] was discharged home on Keflex 500 mg b.i.d. for 1  week, Tylenol and Vicodin p.r.n. pain.  I will have her follow up in my  office in 5 days to have the Merocel pack removed from the left nostril.           ______________________________  Kristine Garbe. Ezzard Standing, M.D.     CEN/MEDQ  D:  01/22/2008  T:  01/22/2008  Job:  [id number]   cc:   Rose Phi. Myna Hidalgo, M.D.  Fleet Contras, M.D.

## 2010-07-24 NOTE — Discharge Summary (Signed)
[name], [name] NO.:  [id number]   MEDICAL RECORD NO.:  [id number]          PATIENT TYPE:  INP   LOCATION:  2014                         FACILITY:  [facility]   PHYSICIAN:  [name], M.D.   DATE OF BIRTH:  [birth year]   DATE OF ADMISSION:  09/12/2008  DATE OF DISCHARGE:  09/15/2008                               DISCHARGE SUMMARY   DISCHARGE DIAGNOSES:  1. Chest pain worrisome for unstable angina, myocardial infarction      ruled out.  2. Anemia, transfused this admission.  3. History of Osler-Weber-Rendu syndrome, followed by [name].  4. Premature ventricular contractions and premature atrial      contractions and nonsustained ventricular tachycardia runs with      essentially negative Myoview as an outpatient in March 2010 and      preserved left ventricular function by echocardiogram.  5. Acute diastolic heart failure, suspected secondary to anemia and      diastolic dysfunction, improved at discharge.   HOSPITAL COURSE:  The patient is a 65 year old female, followed by Dr.  Allyson Shields and [name] and [name].  She has a history of Osler-Weber-  Rendu syndrome.  She was admitted to the emergency room about 1 in the  morning with dyspnea and chest pain.  Please see admission history and  physical for complete details.  CK-MB and troponins were negative.  She  was admitted to telemetry for further evaluation.  Labs showed a  hemoglobin of 8.3.  She has a history of anemia and Osler-Weber-Rendu,  and has been seen by [name], and requires transfusions and iron  infusions periodically.  We asked [name] to see her during this  admission.  She apparently is unable to tolerate p.o. iron.  The patient  was transfused.  Her hemoglobin improved to 10.1.  On admission, her BNP  was 1095.  She was given one dose of IV Lasix with good results.  Her  chest pain resolved.  In light of her recent Myoview being negative in  March 2010, we suspect that  she probably had acute diastolic heart  failure secondary to anemia.  [name] has suggested we try and  keep her hemoglobin greater than 10.  Her echocardiogram done in the  office in March showed preserved LV function, but she did have diastolic  dysfunction and almost hyperdynamic LV contraction.  On the morning of  July 8, she has improved symptomatically with no chest pain and no  shortness of breath.  Her BNP is down to 700.   DISCHARGE LABORATORIES:  Sodium 141, potassium 3.9, BUN 10, creatinine  0.77, and calcium 10.8.  White count 5.7, hemoglobin 10.1, hematocrit  31.2, platelets 371, retic count is 2.1, and RBC 4.04.  D-dimer is less  than 0.22.  Amylase 109.  Hemoglobin A1c 4.9.  Lipase 19.  CK-MB and  troponin are negative.  Cholesterol is 154, HDL 42, and LDL 97.  TSH  1.11.  Urinalysis unremarkable.  Stool was positive, she is on PPI.  Blood type was A positive.  Gallbladder ultrasound was unremarkable.  Abdominal ultrasound showed no aneurysm, otherwise unremarkable.  Chest  x-ray on July 5 showed stable chest, no acute findings.  Telemetry shows  sinus rhythm with PACs and PVCs and occasional runs of bigeminy.   DISCHARGE MEDICATIONS:  1. Metoprolol has been increased to 50 mg in the morning and 25 mg in      the evening.  2. She will continue her Protonix 40 mg a day.   DISCHARGE DIAGNOSES:  1. Chest pain worrisome for unstable angina, myocardial infarction      ruled out, recent negative Myoview in March 2010, her symptoms may      have been secondary to congestive failure.  2. Acute diastolic congestive failure, probably exacerbated by anemia.  3. Anemia, transfused this admission.  4. Osler-Weber-Rendu syndrome.  5. 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  6. History of prior breast cancer, treated with surgery and radiation      in 2001.  7. Prior pulmonary fistula repair at  [facility].   PLAN:  The patient is discharged in stable condition and will follow up  with [name].  She will also need to follow up with [name].  If  she continues to drop her hemoglobin and has positive stools, she may  need a GI evaluation.  We will defer this to her primary care doctor.      [name], P.A.      [name], M.D.  Electronically Signed    LKK/MEDQ  D:  09/15/2008  T:  09/16/2008  Job:  [id number]   cc:   Fleet Contras, M.D.  Rose Phi. Myna Shields, M.D.

## 2010-07-24 NOTE — Consult Note (Signed)
[name], REDEL       ACCOUNT NO.:  [id number]   MEDICAL RECORD NO.:  [id number]          PATIENT TYPE:  OUT   LOCATION:  XRAY                         FACILITY:  [facility]   PHYSICIAN:  Sanjeev K. Deveshwar, M.D.DATE OF BIRTH:  [birth year]   DATE OF CONSULTATION:  DATE OF DISCHARGE:                                 CONSULTATION   DATE OF THE CONSULT:  September 22, 2007.   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 [name].   HISTORY OF PRESENT ILLNESS:  This is a pleasant 65 year old female  referred to Dr. [name] through the courtesy of Dr. 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  The patient has a  history of Osler-Weber-Rendu syndrome.  She previously had a Neuroform  stent placed for cerebral aneurysm at [facility] in  2005.  In February 2008, the patient was seen at [facility]  Emergency Department for left-sided numbness.  It was felt that she was  possibly having a CVA or a TIA.  An MRI/MRA was performed and the  patient was found to have a left periophthalmic artery aneurysm which  measured approximately 3.2 mm.  The patient was seen by Dr. [name] at  that time and started on Keppra for suspected seizure activity.  The  patient ultimately underwent stent placement for a wide-neck aneurysm in  the left periophthalmic artery region performed on November 25, 2006.  The patient has done well since that time.  She returns today to be seen  in follow up.  Her most recent cerebral angiogram was performed on  March 20, 2007.  At that time, the aneurysm appeared to be stable.  There was a mild narrowing of the left internal carotid artery.  She had  a CT scan of the head in March 2009, again this appeared to be stable.   PAST MEDICAL HISTORY:  The patient has had a previous left cerebellar  CVA.  As noted, she had a stent placed at the  base of a wide-neck left  periophthalmic artery aneurysm on November 25, 2006.  She had a  previous aneurysm stenting performed at [facility]  in 2005.  She had a pulmonary fistula repair at [facility].  She is being treated for seizures by Dr. [name].  She has a  history of breast cancer.  She has Osler-Weber-Rendu syndrome.   SURGICAL HISTORY:  The patient is status post right lumpectomy, status  post pulmonary fistula repair, and status post left shoulder surgery.   ALLERGIES:  The patient has no true allergies, although she cannot take  ASPIRIN or other ANTIPLATELET MEDICATIONS due to bleeding.   Current medications include Protonix, Keppra, and occasional Vicodin.   SOCIAL HISTORY:  The patient is divorced.  She has 2 children.  Her  daughter is also a patient of Dr. [name].  She also has Osler-Weber-  Rendu syndrome.  The patient lives in [place], and she does not use  alcohol or tobacco.  She is unemployed.  She is attending school to  learn child care.   FAMILY HISTORY:  The patient's parents' medical history is unknown to  the patient.   IMPRESSION:  Dr. [name] reviewed the results of the patient's  previous angiogram with the patient.  He reassured her that her  aneurysms appear to be stable.  He recommended further follow up MRI/MRA  in approximately 1 year.  The patient has requested an open scanner or  sedation due to a history of claustrophobia.   From medical standpoint, the patient appears be doing well.  She has  occasional discomfort in the right side of her head; however, this is  not severe.  She has noted continued episodic numbness of either her  right or left arm which lasts usually less than a minute.  She denies  any weakness.  This numbness does not affect her legs, possibly the  numbness is coming from a cervical disk problem.  She does note an  occasional fluttering in her chest, but she also has a  Port-A-Cath which  has been in place for 12 years.  She previously had required blood  transfusions.  The Port-A-Cath has not induced recently.  She is  scheduled to see her primary care physician tomorrow.  She was told to  discuss her other medical symptoms with a Dr. Concepcion Elk.   The patient also had a CT angiogram of the chest in March of this year.  This showed no evidence of thoracic aortic dissection or other acute  findings.  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  She  also had a stable left adrenal mass at that time.   Greater than 25 minutes was spent on this follow up visit.      Delton See, P.A.    ______________________________  Grandville Silos. [name], M.D.    DR/MEDQ  D:  09/22/2007  T:  09/23/2007  Job:  [id number]   cc:   Pramod P. [name], MD  Fleet Contras, M.D.

## 2010-07-24 NOTE — H&P (Signed)
NAMENALLELI, LARGENT       ACCOUNT NO.:  [id number]   MEDICAL RECORD NO.:  [id number]          PATIENT TYPE:  INP   LOCATION:  3018                         FACILITY:  [facility]   PHYSICIAN:  Casimiro Needle L. Reynolds, M.D.DATE OF BIRTH:  08/29/1945   DATE OF ADMISSION:  12/30/2006  DATE OF DISCHARGE:                              HISTORY & PHYSICAL   CHIEF COMPLAINT:  Right sided weakness.   HISTORY OF PRESENT ILLNESS:  This is the second [facility] admission for this 65 year old woman, with a past medical  history which includes Osler-Weber-Rendu syndrome and a recent procedure  in which she had a stent placed in her left internal carotid artery for  repair of a left periophthalmic aneurysm.  She was placed on Plavix for  30 days following the procedure, which she had discontinued a few days  ago.  This morning, she woke up feeling well, but some time between 9:30  and 10:00, she developed sudden onset of numbness across the right side  of the body, along with some weakness in the right arm.  She called EMS,  and presented to the emergency department where a code service was  called at 11:30.  The patient's symptoms have remained stable in the  hospital.  She states that she has had intermittent spells of numbness  involving the left side of her body.  She has never had a spell  involving the right side of her body like this before.  She has a slight  headache, but no associated nausea, vomiting, chest pain, palpitations,  or alteration of consciousness.   PAST MEDICAL HISTORY:  1. 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  This is manage by Dr. [name].  2. She has a history of Osler-Weber-Rendu syndrome, with multiple      cerebral aneurysms in the past.  She had a white periophthalmic      aneurysm repair in 2005 at [facility], and a left periophthalmic  aneurysm      repair a little over a month ago by Dr. [name] as noted above.  3. 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  4. She had pulmonary surgery in 1984, presumably for a AV fistula.  5. She had breast cancer in 2001, status post surgery and XRT.   FAMILY HISTORY:  Remarkable for Osler-Weber-Rendu in her daughter.   SOCIAL HISTORY:  She is not presently working.  She denies any history  of tobacco, alcohol or drug use.  She lives alone in [place].   MEDICATIONS:  1. Keppra 250 mg b.i.d.  2. Protonix 40 mg daily.   ALLERGIES:  ASPIRIN, resulting in bleeding.   REVIEW OF SYSTEMS:  Remarkable for the chronic anemia as above.  Otherwise, full 10-system review of systems is negative except as  outlined in the HPI and in the admission nursing record.   PHYSICAL  EXAMINATION:  VITAL SIGNS:  Temperature 98.0, blood pressure  124/82, pulse 93, respirations 17, O2 sat 100% on room air.  GENERAL:  This is a healthy-appearing woman seen in no distress.  HEENT:  Head:  Cranium is normocephalic and atraumatic.  Oropharynx is  benign.  NECK:  Supple without carotid or supraclavicular bruits.  CHEST:  Clear to auscultation bilaterally.  HEART:  Regular rate and rhythm without murmurs.  ABDOMEN:  Soft, normoactive bowel sounds.  EXTREMITIES:  No edema, 2+ pulses.  NEUROLOGIC:  Mental status:  She is awake and alert.  Speech is fluent  and not dysarthric.  Mood is euthymic and  affect appropriate.  She is  able to name and repeat without difficulty.  Cranial nerves:  Pupils are  3 mm and briskly reactive.  Extraocular movements full without  nystagmus.  Visual fields full with confrontation.  Facial sensation is  a little bit diminished in the right compared to the left face.  Tongue  and pallet move normally and symmetrically.  Motor:  Normal bulk and  tone.  She has mild weakness in the  right upper extremity in a pyramidal  pattern.  Her right arm drifts down to the bed.  Right lower extremity,  she does have slight drift with slight weakness, but is able to maintain  the extremity against gravity.  Extremity on the left is normal.  Sensory:  She reports diminished pin-prick sensation to the right upper  extremity compared to the left.  Coordination:  Rapid movements  performed accurately in the left upper extremity and bilateral lower  extremities.  She can perform finger-to-nose on the left, and heel-to-  shin on the right.  Gait is deferred.  Reflexes are symmetric.  Toes are  downgoing bilaterally.   LABORATORY REVIEW:  Labs are pending at this time.  In mid-September,  she had a hemoglobin of 9.1.  CT of the head was personally reviewed,  and the study does not demonstrate any acute abnormality.   IMPRESSION:  Acute left brain subcortical stroke in a patient status  post left carotid artery stenting for periophthalmic artery aneurysm  repair approximately 5 weeks ago.  The situation is complicated by Osler-  Weber-Rendu syndrome, with result of chronic anemia.   PLAN:  Admit to the hospital.  We will resume her Plavix for now.  We  will check an MRI of the brain, along with MRA of the intracranial and  extracranial circulation, along with routine labs.  She did have a  stroke workup in February, which was negative, but she did not have an  echocardiograph at that time, so we will check that.  I have discussed  the situation with Dr. [name], who does not feel that she needs to  have a repeat angiogram at this time.  Stroke service to follow.      Michael L. Thad Ranger, M.D.  Electronically Signed     MLR/MEDQ  D:  12/30/2006  T:  12/31/2006  Job:  [id number]

## 2010-07-24 NOTE — Discharge Summary (Signed)
[name], [name]       ACCOUNT NO.:  [id number]   MEDICAL RECORD NO.:  [id number]          PATIENT TYPE:  INP   LOCATION:  3018                         FACILITY:  [facility]   PHYSICIAN:  Sara P. [name], MD    DATE OF BIRTH:  1945-07-09   DATE OF ADMISSION:  12/30/2006  DATE OF DISCHARGE:  01/01/2007                               DISCHARGE SUMMARY   DISCHARGE DIAGNOSES:  1. 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      Infarct is punctate.  Has mild right hematemesis requiring      outpatient therapies at discharge.  2. Osler-Weber-Rendu  with multiple cerebral aneurysms in the past      with history of right periophthalmic aneurysm repair in 2005 at      [facility].  3. Recent left paraophthalmic aneurysm coiling repair one month ago by      Dr. Kerby Shields.  4. History of episodic left-sided sensory changes thought to represent      a history of seizures that have been improved on Keppra.  5. 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  6. Pulmonary surgery in 1984 for AV fistula.  7. Breast cancer in 2001, status post surgery and radiation therapy.   MEDICINES AT TIME OF DISCHARGE:  1. Protonix 40 mg a day.  2. Keppra 500 mg b.i.d.  3 . Plavix 75 mg a day.   STUDIES PERFORMED.:  1. CT of the brain on admission shows no acute abnormality or atrophy.  2 . MRI of the brain shows small punctate focal area of signal  hyperintensity, left parietal occipital region, cortical base suggestive  of acute ischemia.  Old left cerebellar infarct is stable, mild to  moderate mucosal thickening in ethmoid and maxillary sinus and to a  lesser degree frontal sinuses.  1. MRA of the head shows suboptimal exam, no obvious stenosis.  2. Carotid Doppler shows right 40-60% ICA stenosis in the low range of      the scale.  No left ICA stenosis transcranial Doppler completed.  A      2-D echocardiogram with EF of 60% and no  source of embolus.  3. EKG showed sinus bradycardia with frequent and consecutive      premature ventricular complexes, possible inferior infarct, age      undetermined, cannot pull out anterior infarct, age undetermined.   LABORATORY STUDIES:  Cholesterol 170, triglycerides 93, HDL 41, LDL 110.  Urinalysis with rare bacteria, 3-6 white blood cells, homocystine 8.2.  Urine drug screen negative.  Hemoglobin A1c 4.0.  Coagulation studies on  admission:  INR 1.0 mL, PT 13.6, PTT 121.  White blood cells 5.0, red  blood cells 3.21, hemoglobin 8.9, hematocrit 29.0, MCV 90.2, MCHC 30.9,  RDW 25.6, platelets 329.  Chemistry normal.  Total protein 5.9, albumin  3.0, alkaline phosphatase is 152.   HISTORY OF PRESENT ILLNESS:  [name] is a 65 year old  ambidextrous African American female with history of Osler-Weber-Rendu  and recent procedure where she had a stent placed in her left internal  carotid artery for repair of the left periophthalmic aneurysm.  She was  placed on Plavix for 30 days following the procedure which she  discontinued just a few days ago.  The morning of admission, she woke up  feeling well, but sometimes between 9 and 10 developed sudden onset of  numbness across the right side of her body along with some weakness in  the right arm.  She called EMS who presented to the emergency room where  a Code Stroke was called.  The patient's symptoms have remained stable  in the hospital.  She states she has had intermittent spells of numbness  involving the left side of her body.  She has never had a spell  involving the right side of the body before like this.  She has a slight  headache, no associated nausea, vomiting, chest pain, palpitations or  alteration of consciousness.  T-PA was considered, but was not given  secondary to history of Osler-Weber-Rendu and anemia.  She was admitted  for further evaluation.   HOSPITAL COURSE:  MRI did reveal an very tiny  small left parietal  occipital infarct that may be associated with new right sided weakness.  In hospital, she was placed on aspirin for secondary stroke prevention.  The patient, after speaking with Dr. Corliss Shields, agreed to go back on  Plavix at time of discharge due to aneurysm and recent coiling.  This is  for stroke prevention.  Regardless, she will remain on aspirin or Plavix  most likely.  She was evaluated by PT and OT and felt to benefit from  outpatient physical therapy.  She was safe for discharge home.   CONDITION ON DISCHARGE:  The patient with mild bilateral drift, right  greater than left and mild clumsiness of the right finger-nose-finger.  Her language is normal.  Breath sounds are clear.  Heart rate is  regular.   DISCHARGE/PLAN:  1. Discharge home with family.  2. Plavix for secondary stroke prevention.  3. Follow up Dr. Concepcion Shields within one month.  4. Follow up with Dr. Delia Shields in 2-3 months.      [name], N.P.    ______________________________  [name]. [name], MD    SB/MEDQ  D:  01/01/2007  T:  01/02/2007  Job:  [id number]   cc:   Sara P. [name], MD  Dr. Daphene Shields, M.D.

## 2010-07-24 NOTE — Consult Note (Signed)
NAMEHAILLY, [name]              ACCOUNT NO.:  [id number]   MEDICAL RECORD NO.:  [id number]          PATIENT TYPE:  OUT   LOCATION:  XRAY                         FACILITY:  [facility]   PHYSICIAN:  Sanjeev K. Deveshwar, M.D.DATE OF BIRTH:  December 11, 1945   DATE OF CONSULTATION:  12/09/2006  DATE OF DISCHARGE:  12/09/2006                                 CONSULTATION   REFERRING PHYSICIAN:  Dr. [name].   CHIEF COMPLAINT:  Status post stenting of a wide-neck left  periophthalmic artery aneurysm, performed November 25, 2006 by Dr.  [name].   HISTORY OF PRESENT ILLNESS:  This is a pleasant 65 year old female who  was self-referred to Dr. [name] for history of cerebral aneurysms.  The patient's daughter is also a patient of Dr. [name].  The patient  was seen in consultation on October 31, 2006.  She has a history of Osler-  Weber-Rendu syndrome.  She had a Neuroform stent placed for cerebral  aneurysm at [facility] in 2005.  The physician who  performed the procedures is no longer at [facility]   In February 2008, the patient was seen at [facility] in the  emergency department for left-sided numbness.  It was felt that she may  possibly be having a CVA or TIA.  An MRI/MRA was performed at that time  and a CVA was ruled out, although she was found to have a left  periophthalmic artery aneurysm which measured 3.2 mm.  The patient was  seen by Dr. [name].  She was treated with Keppra for suspected seizure  activity.   The patient was seen in consultation by Dr. [name] on October 31, 2006  for further recommendations regarding her cerebral aneurysm.  Dr.  [name] discussed treatment options including endovascular stenting  and/or coiling of the aneurysm.  The patient was admitted to [facility] on November 25, 2006 for that procedure and underwent  placement of a stent at the base of her wide-neck left periophthalmic  artery aneurysm.  She returned to  [facility] on December 09, 2006 to be seen in followup by Dr. [name].   PAST MEDICAL HISTORY:  1. A left cerebellar CVA.  2. She has a history of breast cancer.  She is status post lumpectomy.  3. She has a history of a cerebral aneurysm treated with a Neuroform      stent placement at [facility] in 2005.  4. She has a history of Osler-Weber-Rendu syndrome.  5. She is being treated for seizures by Dr. [name].  6. She has a history of a pulmonary fistula repaired at [facility] in 1984.   SURGICAL HISTORY:  1. Right lumpectomy.  2. Pulmonary fistula repair.  3. Left shoulder surgery.   She denies previous problems with anesthesia.   ALLERGIES:  The patient is intolerant to aspirin secondary to bleeding  associated with her Osler-Weber-Rendu.   MEDICATIONS:  Keppra, Protonix, Vicodin as well as Plavix, which was  started prior to the stent placement performed November 25, 2006.  SOCIAL HISTORY:  The patient is divorced.  She has 2 children.  She  lives alone in [place].  She denies alcohol or tobacco use.  She is  unemployed.  She is attending school.   FAMILY HISTORY:  The patient's parents' medical history is unknown to  her.   IMPRESSION AND PLAN:  As noted, the patient returns to meet with Dr.  [name] on December 09, 2006 to be seen in followup by Dr. [name]  after stent placement for a wide-neck left periophthalmic artery  aneurysm performed November 25, 2006.  The patient reports that she has  been feeling better.  She has less headaches.  She continues to have  some occasional mild headaches that are relieved by Tylenol.  She  continues on her Plavix.  As noted, she is unable to take aspirin.  Dr.  [name] recommended that she stay on the Plavix until the entire 30-  day course has been completed.  He recommended a followup cerebral  angiogram in approximately 3 months.   Overall, the patient is  doing well.  Greater than 10 minutes was spent  on this consult.      Delton See, P.A.    ______________________________  Grandville Silos. [name], M.D.    DR/MEDQ  D:  12/11/2006  T:  12/12/2006  Job:  [id number]   cc:   Pramod P. [name], MD  Fleet Contras, M.D.
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
## 2010-07-27 NOTE — H&P (Signed)
NAMEGISSELA, BLOCH NO.:  [id number]   MEDICAL RECORD NO.:  [id number]          PATIENT TYPE:  EMS   LOCATION:  MAJO                         FACILITY:  [facility]   PHYSICIAN:  Genene Churn. Love, M.D.    DATE OF BIRTH:  1946-02-04   DATE OF ADMISSION:  DATE OF DISCHARGE:                              HISTORY & PHYSICAL   HISTORY OF PRESENT ILLNESS:  This is the first [facility]  admission for this 65 year old right-handed black divorced female from  [place], [place] admitted from the emergency room for  evaluation of transient left arm and leg numbness and weakness.   Ms. Dannial Monarch has no known history of high blood pressure, diabetes, heart  disease or stroke.  She has a known history of Osler Weber Rondu with a  family history of Osler Weber Rondu and has had pulmonary surgery at  [facility] in [place] in 1984.  She has had a  cerebral aneurysm coiling in 2005 at [facility],  artery unknown.  Yesterday, she developed transient left hand and face  numbness and stiffness lasting minutes.  She was going to her doctor,  talked with her doctor and then went home.  She then had the episode  occur again today at 3:45 p.m. and was seen at urgent care and sent to  [facility] as a code stroke.  Her symptoms began to improve on  the way.  She had no associated headache, syncope, seizure, chest pain  or palpitations.   PAST MEDICAL HISTORY:  Significant for Osler Weber Rondu with a history  of GI bleeding and a nasal bleeding.  She is status post right  lumpectomy for breast in 2001 and received external rotation, what I  think is Tamoxifen and probably Arimidex subsequently.  She has had a  history of pulmonary surgery which I think was a fistula but I am not  certain in 1984.  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   SOCIAL HISTORY:  She finished 12th grade school.  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  She does not smoke cigarettes.  She does not drink alcohol.   ALLERGIES:  She has a history of allergy to ASPIRIN which causes  bleeding.   FAMILY HISTORY:  Father and mother's history is unknown.  She had three  sisters, one of whom is deceased.  She has two sisters living and well.  She has four brothers whose health unknown.  She has one daughter 52 and  another 60.  One has had Osler Weber Rondu with strokes and is Coumadin.  The patient has had other operations including a left shoulder  operation.   CURRENT MEDICATIONS:  Avelox 400 mg daily, Protonix 40 mg daily.   PHYSICAL EXAMINATION:  GENERAL:  Well-developed black female.  VITAL SIGNS:  Blood pressure right and left arm 100/60, 110/60, heart  rate of 68. No bruit were heard.  MENTAL STATUS:  She was alert, oriented x3.  Follow one to three step  commands.  CRANIAL NERVE:  Visual fields full, discs flat.  Extraocular movements  full. Corneal's present.  Face symmetric.  Tongue midline.  Uvula  midline, gag is present.  Motor examination 5/5 strength in upper and  lower extremities, some left leg clumsiness.  Mild decreased pin prick  in left arm.  Deep tendon reflexes are 2+, plantar responses are down  going.  Stroke scale possibly 2 but that is slightly a stretch.  HEENT:  Tympanic membranes are clear.  LUNGS: Clear to auscultation.  HEART:  Examination without murmur.  ABDOMEN:  Bowel sounds are normal.  There is no enlargement of liver,  spleen or kidneys. There is no cyanosis, clubbing or edema.   LABORATORY DATA:  CT scan of the brain was negative.   IMPRESSION:  1. Right brain transient ischemic attack 435.9.  2. Osler Weber Rondu, code unknown.  3. Status post pulmonary surgery.  4. Status post aneurysm coiling.  5. History of breast cancer.   PLAN:  Admit the patient for further evaluation to include MRI study of  the brain.  Etiology of her symptoms is unknown at this time and aspirin  will  not be given because of her history of Osler Weber Rondu.           ______________________________  Genene Churn. Sandria Manly, M.D.     JML/MEDQ  D:  05/07/2006  T:  05/08/2006  Job:  [id number]

## 2010-07-27 NOTE — Discharge Summary (Signed)
Shields, Sara              ACCOUNT NO.:  [id number]   MEDICAL RECORD NO.:  [id number]          PATIENT TYPE:  INP   LOCATION:  4733                         FACILITY:  [facility]   PHYSICIAN:  Pramod P. [name], MD    DATE OF BIRTH:  Oct 14, 1945   DATE OF ADMISSION:  05/07/2006  DATE OF DISCHARGE:  05/08/2006                               DISCHARGE SUMMARY   DIAGNOSES AT TIME OF DISCHARGE:  1. No acute infarct.  2. Left body numbness which is recurrent; question seizure.  3. Osler-Weber-Rendu.  4. Epistaxis.  5. History of cerebral aneurysm coiling 2005.  6. 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  7. Status post right lumpectomy for breast cancer.  8. Pulmonary fistula repair 1984.   MEDICINES AT TIME OF DISCHARGE:  1. Keppra 250 mg b.i.d.  2. Protonix 40 mg a day.  3. Complete Avelox prescription as prior to admission.   STUDIES PERFORMED:  1. CT of the head on admission shows no acute abnormality.  Remote      history of left cerebellar infarct.  2. Chest x-ray shows mild cardiomegaly postop changes.  3. MRI of the brain shows no acute infarct.  Atrophy with altered      signal intensity of the globe pallidi potentially related to      underlying metabolic abnormalities/liver disease.  Paranasal sinus      opacification.  4. MRA of the head shows slight motion degraded exam reveals      intracranial atherosclerotic changes.  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  5. MRA of the neck shows no hemodynamically significant stenosis.  6. EEG performed, results pending.  7. TCD performed, results pending.  8. 2D echocardiogram performed, results pending.  9. Carotid Doppler performed.  No significant ICA stenosis noted.  10.EKG shows sinus rhythm with occasional PVCs and fusion complexes,      cannot rule out anterior infarct.   LABORATORY STUDIES:  CBC with hemoglobin 8.6, hematocrit 27.9, white  blood cell 5.8 and  platelets 550.  Differential was normal.  Chemistry  normal.  Coagulation study normal.  Liver function tests with AST 17,  ALT 19, alkaline phosphatase 118, total bilirubin 0.5, albumin 3.  Cardiac enzymes were negative.  Cholesterol 146, triglycerides 76, HDL  36, LDL 95.  Urinalysis showed 3-6 white blood cells, 0-2 red blood  cells.  Homocysteine was pending.  Urine drug screen was negative.  Alcohol level less than 5.  Hemoglobin A1c 3.6.   HISTORY OF PRESENT ILLNESS:  This is the first [facility]  admission for [name], a 65 year old right-handed divorced  African American female who was admitted to the emergency room for  evaluation of left arm and left leg numbness and weakness.  [name]  has no known history of high blood pressure, diabetes, heart disease or  stroke.  She has a known history of Osler-Weber-Rendu with a family  history of Osler-Weber-Rendu.  She had pulmonary surgery at [facility],  in April 1984, apparently a pulmonary fistula  repair.  She  has also had a cerebral aneurysm coiling in 2005 at [facility], artery unknown.  The day prior to admission she developed  transient left hand and face numbness and stiffness lasting several  minutes.  She was on her way to see her doctor, talked with her doctor  and then went home.  The episode recurred again the day of admission and  she was seen at urgent care and sent to [facility] as a code stroke.  Her symptoms improved in the emergency room.  She was not a TPA  candidate secondary to quick resolution of symptoms.  She was admitted  to the hospital for further evaluation.   HOSPITAL COURSE:  MRI was negative for acute infarct.  When you ask her  about left-sided numbness, it has been recurrent over several years;  question seizure.  As it is recurrent she was placed on Keppra for  prophylactic seizure control.  She will follow up with Dr. [name] in 2  months and they will  determine future plan of care.   CONDITION AT DISCHARGE:  Neurologically normal.   DISCHARGE PLAN:  1. Discharge home.  2. Prophylactic Keppra, new.  3. Follow up with Dr. Delia Heady in 2 to 3 months.      [name], N.P.    ______________________________  Sunny Schlein. [name], MD    SB/MEDQ  D:  05/08/2006  T:  05/09/2006  Job:  [id number]   cc:   Pramod P. [name], MD  Meredith Staggers, M.D.

## 2010-07-27 NOTE — Procedures (Signed)
NAME:  ADELY, FACER              ACCOUNT NO.:  [id number]   MEDICAL RECORD NO.:  [id number]          PATIENT TYPE:  OUT   LOCATION:  SLEEP CENTER                 FACILITY[facility]   PHYSICIAN:  Clinton D. Maple Hudson, MD, FCCP, FACPDATE OF BIRTH:  [birth year]1945   DATE OF STUDY:  06/13/2006                            NOCTURNAL POLYSOMNOGRAM   REFERRING PHYSICIAN:  Fleet Contras, M.D.   INDICATION FOR STUDY:  Hypersomnia with sleep apnea.   EPWORTH SLEEPINESS SCORE:  21/24, BMI 32.9, weight 205 pounds.   MEDICATIONS:   SLEEP ARCHITECTURE:  Total sleep time 322 minutes with sleep efficiency  89%.  Stage I was 4%, stage II 64%, stages III and IV 13%, REM 20% of  total sleep time.  Sleep latency 10 minutes, REM latency 51 minutes,  awake after sleep onset 30 minutes, arousal index 35.4.  No bedtime  medication was taken.   RESPIRATORY DATA:  Diagnostic NPSG protocol requested.  Apnea-hypopnea  index (AHI, RDI) 8.6 obstructive events per hour, indicating mild  obstructive sleep apnea/hypopnea syndrome.  There were 14 obstructive  apneas and 32 hypopneas.  Events were not positional, equally frequent  while supine and sleeping on the left side.  REM AHI 41.2.   OXYGEN DATA:  Moderate snoring with oxygen desaturation to a nadir of  73%.  Mean oxygen saturation through the study was 98% on room air.   CARDIAC DATA:  Sinus rhythm with occasional PVC.   MOVEMENT-PARASOMNIA:  A total of 26 limb jerks were recorded, of which 8  were associated with arousal or awakening, for a periodic limb movement  with arousal index of 1.5 per hour which is of doubtful significance.   IMPRESSIONS-RECOMMENDATIONS:  1. Unremarkable sleep architecture for sleep center environment.  2. Mild obstructive sleep apnea/hypopnea syndrome, apnea-hypopnea      index 8.6 per hour with nonpositional events, moderate snoring and      oxygen desaturation to a nadir of 73%.  3. Scores in this range are intermediate for  consideration of      continuous positive airway pressure therapy which can be tried if      more conservative measures such as weight loss and treatment for      upper airway obstruction      such as nasal congestion, is insufficient.  4. Minimal periodic limb movement with arousal, 1.5 per hour, of      doubtful significance.      Clinton D. Maple Hudson, MD, [facility], FACP  Diplomate, Biomedical engineer of Sleep Medicine  Electronically Signed     CDY/MEDQ  D:  06/22/2006 40:98:11  T:  06/22/2006 10:11:54  Job:  [id number]

## 2010-07-27 NOTE — Op Note (Signed)
[name], [name]              ACCOUNT NO.:  [id number]   MEDICAL RECORD NO.:  [id number]          PATIENT TYPE:  AMB   LOCATION:  ENDO                         FACILITY:  [facility]   PHYSICIAN:  Anselmo Rod, M.D.  DATE OF BIRTH:  October 10, 1945   DATE OF PROCEDURE:  01/07/2005  DATE OF DISCHARGE:                                 OPERATIVE REPORT   PROCEDURE:  Screening colonoscopy.   ENDOSCOPIST:  Anselmo Rod, M.D.   INSTRUMENT:  Olympus video colonoscope.   INDICATIONS FOR PROCEDURE:  A 65 year old African-American female with a  personal history of breast cancer on Arimidex who underwent a screening  colonoscopy.  The patient was found to have guaiac-positive stool on initial  examination.  She also has a history of nose bleeds and is being followed by  Dr. Narda Bonds for the same.  Rule out colonic polyps, masses, AVMs, etc.   PRE-PROCEDURE PREPARATION:  Informed consent was secured from the patient.  The patient was fasting for eight hours prior to the procedure, prepped with  a bottle of magnesium citrate and a gallon of GoLYTELY the night prior to  the procedure.  The risks and benefits of the procedure including 10% missed  rate of cancer and polyps was discussed with her.   PRE-PROCEDURE PHYSICAL:  The patient had stable vital signs.  NECK:  Supple.  CHEST:  Clear to auscultation.  S1, S2, Regular.  ABDOMEN:  Soft with normoactive bowel sounds.   DESCRIPTION OF PROCEDURE:  The patient was placed in left lateral decubitus  position and sedated with 80 mg Demerol and 10 mg Versed in slow incremental  doses.  Once the patient was adequately sedated and maintained on low-flow  oxygen, continuous cardiac monitoring, the Olympus video colonoscope was  advanced from the rectum to the cecum to the appendicular orifice.  The  ileocecal valve was clearly visualized and photographed.  The patient had  some residue in the colon.  Multiple washes were done.  The terminal  ileum  appeared healthy and without lesions.  Scattered small erythematous spots  were seen throughout the colon.  I assume these were AVMs.  There were  multiple such lesions throughout the colon.  No intervention was undertaken  for obvious reasons.  Retroflexion in the rectum revealed small internal  hemorrhoids.  There was no evidence of diverticulosis, masses or polyps.   IMPRESSION:  1.  Scattered erythematous spots throughout the colon, question AVMs, not      related.  Otherwise, normal colonoscopy.  2.  Small internal hemorrhoids.   RECOMMENDATIONS:  1.  A repeat colonoscopy has been recommended in the next five years      considering her personal history of breast      cancer unless she has any reasons to do this at an earlier date.  2.  Continue present medications.  3.  Outpatient followup in the next four weeks, earlier if need be.      Anselmo Rod, M.D.  Electronically Signed     JNM/MEDQ  D:  01/07/2005  T:  01/07/2005  Job:  [id number]   cc:   Fleet Contras, M.D.  Fax: [phone]   Rose Phi. Myna Hidalgo, M.D.  Fax: [phone]   Kristine Garbe. Ezzard Standing, M.D.  Fax: [phone]

## 2010-11-29 LAB — CBC
HCT: 30.2 — ABNORMAL LOW
Hemoglobin: 9.6 — ABNORMAL LOW
MCHC: 31.6
MCV: 85.9
Platelets: 387
RBC: 3.52 — ABNORMAL LOW
RDW: 25.9 — ABNORMAL HIGH
WBC: 6.4

## 2010-11-29 LAB — BASIC METABOLIC PANEL
BUN: 9
CO2: 26
Calcium: 10.4
Chloride: 111
Creatinine, Ser: 0.55
GFR calc Af Amer: >60
GFR calc non Af Amer: >60
Glucose, Bld: 94
Potassium: 4
Sodium: 140

## 2010-11-29 LAB — DIFFERENTIAL
Basophils Absolute: 0
Eosinophils Absolute: 0.2
Lymphs Abs: 1.1
Monocytes Absolute: 0.6
Neutrophils Relative %: 71

## 2010-11-29 LAB — PROTIME-INR: Prothrombin Time: 13.8

## 2010-12-05 LAB — CBC
MCHC: 31.9
MCV: 82.3
Platelets: 384
RDW: 19.1 — ABNORMAL HIGH

## 2010-12-05 LAB — POCT I-STAT, CHEM 8
Creatinine, Ser: 0.7
HCT: 35 — ABNORMAL LOW
Hemoglobin: 11.9 — ABNORMAL LOW
Potassium: 3.7
Sodium: 143
TCO2: 26

## 2010-12-05 LAB — DIFFERENTIAL
Basophils Absolute: 0.1
Basophils Relative: 1
Eosinophils Absolute: 0.3
Neutro Abs: 3.6
Neutrophils Relative %: 59

## 2010-12-05 LAB — POCT CARDIAC MARKERS
CKMB, poc: 2.5
Myoglobin, poc: 86.6
Operator id: 277751

## 2010-12-05 LAB — D-DIMER, QUANTITATIVE: D-Dimer, Quant: 0.23

## 2010-12-14 LAB — CROSSMATCH: ABO/RH(D): A POS

## 2010-12-19 LAB — DIFFERENTIAL
Basophils Relative: 1
Eosinophils Relative: 3
Lymphs Abs: 0.9
Monocytes Absolute: 0.4
Neutro Abs: 3.4

## 2010-12-19 LAB — URINALYSIS, ROUTINE W REFLEX MICROSCOPIC
Bilirubin Urine: NEGATIVE
Nitrite: NEGATIVE
Specific Gravity, Urine: 1.02
Urobilinogen, UA: 1

## 2010-12-19 LAB — COMPREHENSIVE METABOLIC PANEL
ALT: 26
Albumin: 3 — ABNORMAL LOW
Alkaline Phosphatase: 152 — ABNORMAL HIGH
Chloride: 110
Glucose, Bld: 92
Potassium: 4.1
Sodium: 140
Total Protein: 5.9 — ABNORMAL LOW

## 2010-12-19 LAB — CK TOTAL AND CKMB (NOT AT ARMC)
CK, MB: 3
Total CK: 95

## 2010-12-19 LAB — HEMOGLOBIN A1C
Hgb A1c MFr Bld: 4 — ABNORMAL LOW
Mean Plasma Glucose: 65

## 2010-12-19 LAB — RAPID URINE DRUG SCREEN, HOSP PERFORMED
Amphetamines: NOT DETECTED
Opiates: NOT DETECTED
Tetrahydrocannabinol: NOT DETECTED

## 2010-12-19 LAB — URINE MICROSCOPIC-ADD ON

## 2010-12-19 LAB — CBC
HCT: 29 — ABNORMAL LOW
Hemoglobin: 8.9 — ABNORMAL LOW
MCHC: 30.9
RBC: 3.21 — ABNORMAL LOW

## 2010-12-19 LAB — LIPID PANEL: Triglycerides: 93

## 2010-12-19 LAB — HOMOCYSTEINE: Homocysteine: 8.2

## 2010-12-20 LAB — CBC
HCT: 28.6 — ABNORMAL LOW
HCT: 28.6 — ABNORMAL LOW
Hemoglobin: 9.1 — ABNORMAL LOW
Hemoglobin: 9.2 — ABNORMAL LOW
RBC: 3.37 — ABNORMAL LOW
RBC: 3.37 — ABNORMAL LOW
RDW: 19.4 — ABNORMAL HIGH
WBC: 6.4
WBC: 7.9

## 2010-12-20 LAB — APTT: aPTT: 42 — ABNORMAL HIGH

## 2010-12-20 LAB — BASIC METABOLIC PANEL
GFR calc non Af Amer: >60
Glucose, Bld: 126 — ABNORMAL HIGH
Potassium: 3.7
Sodium: 139

## 2010-12-21 LAB — CBC
HCT: 33 — ABNORMAL LOW
Hemoglobin: 10.6 — ABNORMAL LOW
MCHC: 32.1
MCV: 85.8
RBC: 3.85 — ABNORMAL LOW
RDW: 19.4 — ABNORMAL HIGH

## 2010-12-21 LAB — DIFFERENTIAL
Basophils Absolute: 0
Basophils Relative: 1
Eosinophils Absolute: 0.3
Eosinophils Relative: 6 — ABNORMAL HIGH
Monocytes Absolute: 0.4
Monocytes Relative: 9

## 2010-12-21 LAB — BASIC METABOLIC PANEL
CO2: 26
Chloride: 111
GFR calc Af Amer: >60
Glucose, Bld: 114 — ABNORMAL HIGH
Sodium: 139

## 2010-12-21 LAB — APTT: aPTT: 34

## 2015-12-15 ENCOUNTER — Telehealth: Payer: Self-pay | Admitting: Cardiovascular Disease

## 2015-12-15 NOTE — Telephone Encounter (Signed)
Closed encounter °

## 2015-12-18 ENCOUNTER — Encounter: Payer: Self-pay | Admitting: Hematology & Oncology

## 2015-12-19 ENCOUNTER — Institutional Professional Consult (permissible substitution): Payer: Self-pay | Admitting: Pulmonary Disease

## 2015-12-19 ENCOUNTER — Telehealth: Payer: Self-pay | Admitting: Cardiovascular Disease

## 2015-12-19 NOTE — Telephone Encounter (Signed)
Received records from [facility] for appointment on 12/22/15 with Dr Gwenlyn Found.  Records given to [employer] (medical records) for Dr Kennon Holter schedule on 12/22/15. lp

## 2015-12-22 ENCOUNTER — Encounter: Payer: Self-pay | Admitting: *Deleted

## 2015-12-22 ENCOUNTER — Emergency Department (HOSPITAL_COMMUNITY)
Admission: EM | Admit: 2015-12-22 | Discharge: 2015-12-22 | Disposition: A | Discharge status: Home / Self Care | Payer: Medicare Other | Attending: Emergency Medicine | Admitting: Emergency Medicine

## 2015-12-22 ENCOUNTER — Emergency Department (HOSPITAL_COMMUNITY): Payer: Medicare Other

## 2015-12-22 ENCOUNTER — Encounter (HOSPITAL_COMMUNITY): Payer: Self-pay | Admitting: *Deleted

## 2015-12-22 ENCOUNTER — Ambulatory Visit: Payer: Self-pay | Admitting: Cardiovascular Disease

## 2015-12-22 DIAGNOSIS — I5021 Acute systolic (congestive) heart failure: Secondary | ICD-10-CM | POA: Diagnosis not present

## 2015-12-22 DIAGNOSIS — Z853 Personal history of malignant neoplasm of breast: Secondary | ICD-10-CM | POA: Diagnosis not present

## 2015-12-22 DIAGNOSIS — R0602 Shortness of breath: Secondary | ICD-10-CM | POA: Diagnosis present

## 2015-12-22 DIAGNOSIS — Z8673 Personal history of transient ischemic attack (TIA), and cerebral infarction without residual deficits: Secondary | ICD-10-CM | POA: Diagnosis not present

## 2015-12-22 DIAGNOSIS — Z79899 Other long term (current) drug therapy: Secondary | ICD-10-CM | POA: Insufficient documentation

## 2015-12-22 HISTORY — DX: Hereditary hemorrhagic telangiectasia: I78.0

## 2015-12-22 LAB — CBC
HCT: 34.3 % — ABNORMAL LOW (ref 36.0–46.0)
Hemoglobin: 9.5 g/dL — ABNORMAL LOW (ref 12.0–15.0)
MCH: 21.4 pg — ABNORMAL LOW (ref 26.0–34.0)
MCHC: 27.7 g/dL — ABNORMAL LOW (ref 30.0–36.0)
MCV: 77.3 fL — ABNORMAL LOW (ref 78.0–100.0)
PLATELETS: 277 10*3/uL (ref 150–400)
RBC: 4.44 MIL/uL (ref 3.87–5.11)
RDW: 27.3 % — AB (ref 11.5–15.5)
WBC: 3.6 10*3/uL — AB (ref 4.0–10.5)

## 2015-12-22 LAB — BASIC METABOLIC PANEL
Anion gap: 6 (ref 5–15)
BUN: 8 mg/dL (ref 6–20)
CALCIUM: 10.6 mg/dL — AB (ref 8.9–10.3)
CO2: 28 mmol/L (ref 22–32)
CREATININE: 0.71 mg/dL (ref 0.44–1.00)
Chloride: 105 mmol/L (ref 101–111)
GFR calc Af Amer: >60 mL/min (ref >60)
Glucose, Bld: 87 mg/dL (ref 65–99)
Potassium: 4.2 mmol/L (ref 3.5–5.1)
SODIUM: 139 mmol/L (ref 135–145)

## 2015-12-22 LAB — D-DIMER, QUANTITATIVE: D-Dimer, Quant: 0.5 ug/mL-FEU (ref 0.00–0.50)

## 2015-12-22 LAB — I-STAT TROPONIN, ED
TROPONIN I, POC: 0.01 ng/mL (ref 0.00–0.08)
Troponin i, poc: 0.01 ng/mL (ref 0.00–0.08)

## 2015-12-22 LAB — BRAIN NATRIURETIC PEPTIDE: B NATRIURETIC PEPTIDE 5: 388.6 pg/mL — AB (ref 0.0–100.0)

## 2015-12-22 MED ORDER — SODIUM CHLORIDE 0.9% FLUSH
10.0000 mL | INTRAVENOUS | Status: DC | PRN
  Administered 2015-12-22: 40 mL
  Filled 2015-12-22: qty 40

## 2015-12-22 MED ORDER — FUROSEMIDE 10 MG/ML IJ SOLN
40.0000 mg | Freq: Once | INTRAMUSCULAR | Status: AC
  Administered 2015-12-22: 40 mg via INTRAVENOUS
  Filled 2015-12-22: qty 4

## 2015-12-22 NOTE — ED Provider Notes (Addendum)
By signing my name below, I, [name], attest that this documentation has been prepared under the direction and in the presence of [place], DO . Electronically Signed: Higinio Shields, Scribe. 12/22/2015. 3:37 AM.  TIME SEEN: 3:52 AM  CHIEF COMPLAINT: Shortness of Breath   HPI:   [name] is a 70 y.o. female with PMHx of pulmonary HTN, chronic bronchitis, CHF who presents to the Emergency Department complaining of gradually worsening, intermittent, shortness of breath that began 3 weeks ago. Pt reports her shortness of breath is exacerbated when amubulating and lying flat on her back. She notes associated bilateral leg swelling that also began 3 weeks ago; she reports she takes 40 mg Lasix BID. She states she visited her PCP for her symptoms but was not told of any cause. Pt reports she was "tired of feeling this way" and visited the ED tonight to "figure out what was wrong with her." She denies fever or cough. Denies chest pain or chest discomfort. No history of PE or DVT.  States symptoms have been present for 3 weeks and have not changed. They're not getting worse. She states she is really just here because she wants a diagnosis.  ROS: See HPI Constitutional: no fever  Eyes: no drainage  ENT: no runny nose   Cardiovascular:  no chest pain  Resp: SOB  GI: no vomiting GU: no dysuria Integumentary: no rash  Allergy: no hives  Musculoskeletal: bilateral leg swelling  Neurological: no slurred speech ROS otherwise negative  PAST MEDICAL HISTORY/PAST SURGICAL HISTORY:  Past Medical History:  Diagnosis Date  . Allergic rhinitis   . Anemia   . Breast cancer ([place])   . Chronic bronchiolitis ([place])   . Chronic bronchiolitis ([place])   . CVA (cerebral vascular accident) ([place])   . Degenerative lumbar disc   . GERD (gastroesophageal reflux disease)   . Hemorrhage   . OAB (overactive bladder)   . 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 Peroneal neuropathy   . Pulmonary HTN   .  Retinal detachment   . Sleep apnea   . SVT (supraventricular tachycardia) ([place])   . Systolic heart failure (H[facility])    MEDICATIONS:  Prior to Admission medications   Medication Sig Start Date End Date Taking? Authorizing Provider  albuterol (PROVENTIL HFA;VENTOLIN HFA) 108 (90 Base) MCG/ACT inhaler Inhale 1-2 puffs into the lungs every 6 (six) hours as needed for wheezing or shortness of breath.    Historical Provider, MD  amiodarone (PACERONE) 200 MG tablet Take 200 mg by mouth daily.    Historical Provider, MD  epoetin alfa (EPOGEN,PROCRIT) 4000 UNIT/ML injection Inject 4,000 Units into the vein once a week.    Historical Provider, MD  fluticasone (FLONASE) 50 MCG/ACT nasal spray Place 2 sprays into both nostrils daily.    Historical Provider, MD  furosemide (LASIX) 40 MG tablet Take 40 mg by mouth.    Historical Provider, MD  Ipratropium-Albuterol (COMBIVENT RESPIMAT) 20-100 MCG/ACT AERS respimat Inhale 1 puff into the lungs every 6 (six) hours.    Historical Provider, MD  Iron Combinations (CHROMAGEN) capsule Take 1 capsule by mouth daily.    Historical Provider, MD  metoprolol tartrate (LOPRESSOR) 25 MG tablet Take 25 mg by mouth 2 (two) times daily.    Historical Provider, MD  pantoprazole (PROTONIX) 40 MG tablet Take 40 mg by mouth daily.    Historical Provider, MD  Tadalafil, PAH, (ADCIRCA PO) Take 20 mg by mouth daily.    Historical Provider, MD  ALLERGIES:  Allergies  Allergen Reactions  . Aspirin     SOCIAL HISTORY:  Social History  Substance Use Topics  . Smoking status: Never Smoker  . Smokeless tobacco: Never Used  . Alcohol use No    FAMILY HISTORY: Family History  Problem Relation Age of Onset  . Colon cancer Father     EXAM: BP 148/70 (BP Location: Left Arm)   Pulse 89   Temp 97.7 F (36.5 C) (Oral)   Resp 18   Ht 5\' 6"  (1.676 m)   Wt 168 lb (76.2 kg)   SpO2 100%   BMI 27.12 kg/m  CONSTITUTIONAL: Alert and oriented and responds appropriately to  questions. Elderly, chronically ill-appearing HEAD: Normocephalic EYES: Conjunctivae clear, PERRL ENT: normal nose; no rhinorrhea; moist mucous membranes NECK: Supple, no meningismus, no LAD  CARD: RRR; S1 and S2 appreciated; no murmurs, no clicks, no rubs, no gallops RESP: Normal chest excursion without splinting or tachypnea; breath sounds equal bilaterally and clear, no rales, no rhonchi or wheezing, speaking short sentences but no hypoxia or respiratory distress ABD/GI: Normal bowel sounds; non-distended; soft, non-tender, no rebound, no guarding, no peritoneal signs BACK:  The back appears normal and is non-tender to palpation, there is no CVA tenderness EXT: Normal ROM in all joints; non-tender to palpation; mild nonpitting edema in bilateral lower extremities; normal capillary refill; no cyanosis, no calf tenderness or swelling    SKIN: Normal color for age and race; warm; no rash NEURO: Moves all extremities equally, sensation to light touch intact diffusely, cranial nerves II through XII intact PSYCH: The patient's mood and manner are appropriate. Grooming and personal hygiene are appropriate.  MEDICAL DECISION MAKING: Patient here shortness of breath. She does appear mildly volume overloaded. She has mild peripheral edema. No JVD. Chest x-ray shows vascular congestion but no alveolar edema. We'll give a dose of IV Lasix. She does appear short of breath whenever she is just sitting in bed and talking to me that her oxygen level does not drop on room air. I have offered admission for IV diuresis but at this time patient is reluctant. She agrees to stay for IV Lasix in the emergency department and for a repeat troponin and a d-dimer level to be drawn.  ED PROGRESS: Patient was able to ambulate to the bathroom and her oxygen saturation stayed between 99 and 100% on room air.   7:30 AM  Pt reports feeling better. She has not been hypoxic. She is now able to talk to me normally in long  sentences without any signs of shortness of breath. Again she does not have significant signs of volume overload. She will be given IV Lasix prior to discharge. She has been diuresing well in the emergency department previous to this. Her second troponin is negative. Her d-dimer is negative. Her BNP is mildly elevated at 388 but is lower than her last BNP in 2010. I have offered her admission but She would like discharge, I feel that this is an appropriate Shields. She plans to follow up with her PCP, Dr. Jeanie Cooks, today.  I will give her outpatient cardiology follow-up information. Have discussed with her at length return precautions and recommended she return to the ED if symptoms worsen. She is comfortable with this Shields. She is very well-appearing currently in any respiratory issues she has before seemed to have completely resolved. She states she is feeling better.   At this time, I do not feel there is any life-threatening condition present.  I have reviewed and discussed all results (EKG, imaging, lab, urine as appropriate), exam findings with patient/family. I have reviewed nursing notes and appropriate previous records.  I feel the patient is safe to be discharged home without further emergent workup and can continue workup as an outpatient as needed. Discussed usual and customary return precautions. Patient/family verbalize understanding and are comfortable with this Shields.  Outpatient follow-up has been provided. All questions have been answered.   EKG Interpretation  Date/Time:  Friday December 22 2015 01:07:33 EDT Ventricular Rate:  88 PR Interval:  156 QRS Duration: 102 QT Interval:  404 QTC Calculation: 488 R Axis:   96 Text Interpretation:  Normal sinus rhythm Possible Left atrial enlargement Rightward axis Anterior infarct , age undetermined Abnormal ECG No significant change since last tracing Confirmed by Amaru Burroughs,  DO, Michaelah Credeur [phone]) on 12/22/2015 3:37:56 AM       I personally performed  the services described in this documentation, which was scribed in my presence. The recorded information has been reviewed and is accurate.     [place], DO 12/22/15 [place], DO 12/22/15 [phone]

## 2015-12-22 NOTE — ED Notes (Signed)
Patient ambulated to the bathroom with 02 status at 99%-100%.

## 2015-12-22 NOTE — ED Notes (Signed)
Pt refused to allow blood drawal after accessing her port was not successful. RN will request IV team attempt to place a line.

## 2015-12-22 NOTE — ED Triage Notes (Signed)
Pt c/o bilateral leg swelling and shortness of breath for two weeks. Pt reports no new symptoms tonight.

## 2015-12-22 NOTE — Discharge Instructions (Signed)
Your cardiac labs today were normal. We did a blood test called a d-dimer that was negative that rolls out blood clots. Your chest x-ray did not show any pneumonia or significant fluid on your lungs. He did have a small amount of fluid in your legs which we have given you a dose of IV Lasix. I recommend you continue your Lasix 40 mg twice a day and follow up with your primary care physician today.  I recommend that you establish care with a cardiologist.

## 2015-12-22 NOTE — ED Notes (Signed)
IV team bedside. 

## 2016-01-02 ENCOUNTER — Observation Stay (HOSPITAL_COMMUNITY)
Admission: EM | Admit: 2016-01-02 | Discharge: 2016-01-03 | Disposition: A | Discharge status: Home / Self Care | Payer: Medicare Other | Attending: Nephrology | Admitting: Nephrology

## 2016-01-02 ENCOUNTER — Encounter (HOSPITAL_COMMUNITY): Payer: Self-pay

## 2016-01-02 DIAGNOSIS — Z886 Allergy status to analgesic agent status: Secondary | ICD-10-CM | POA: Diagnosis not present

## 2016-01-02 DIAGNOSIS — D500 Iron deficiency anemia secondary to blood loss (chronic): Secondary | ICD-10-CM | POA: Diagnosis not present

## 2016-01-02 DIAGNOSIS — R040 Epistaxis: Secondary | ICD-10-CM | POA: Insufficient documentation

## 2016-01-02 DIAGNOSIS — Z6826 Body mass index (BMI) 26.0-26.9, adult: Secondary | ICD-10-CM | POA: Insufficient documentation

## 2016-01-02 DIAGNOSIS — N3281 Overactive bladder: Secondary | ICD-10-CM | POA: Insufficient documentation

## 2016-01-02 DIAGNOSIS — J449 Chronic obstructive pulmonary disease, unspecified: Secondary | ICD-10-CM | POA: Diagnosis not present

## 2016-01-02 DIAGNOSIS — D649 Anemia, unspecified: Secondary | ICD-10-CM | POA: Diagnosis not present

## 2016-01-02 DIAGNOSIS — Z8673 Personal history of transient ischemic attack (TIA), and cerebral infarction without residual deficits: Secondary | ICD-10-CM | POA: Insufficient documentation

## 2016-01-02 DIAGNOSIS — K219 Gastro-esophageal reflux disease without esophagitis: Secondary | ICD-10-CM | POA: Insufficient documentation

## 2016-01-02 DIAGNOSIS — I5040 Unspecified combined systolic (congestive) and diastolic (congestive) heart failure: Secondary | ICD-10-CM | POA: Diagnosis not present

## 2016-01-02 DIAGNOSIS — J410 Simple chronic bronchitis: Secondary | ICD-10-CM

## 2016-01-02 DIAGNOSIS — I780 Hereditary hemorrhagic telangiectasia: Secondary | ICD-10-CM | POA: Diagnosis not present

## 2016-01-02 DIAGNOSIS — G4733 Obstructive sleep apnea (adult) (pediatric): Secondary | ICD-10-CM | POA: Insufficient documentation

## 2016-01-02 DIAGNOSIS — K922 Gastrointestinal hemorrhage, unspecified: Secondary | ICD-10-CM | POA: Insufficient documentation

## 2016-01-02 DIAGNOSIS — S42022A Displaced fracture of shaft of left clavicle, initial encounter for closed fracture: Secondary | ICD-10-CM | POA: Diagnosis not present

## 2016-01-02 DIAGNOSIS — I471 Supraventricular tachycardia, unspecified: Secondary | ICD-10-CM

## 2016-01-02 DIAGNOSIS — Z9889 Other specified postprocedural states: Secondary | ICD-10-CM | POA: Insufficient documentation

## 2016-01-02 DIAGNOSIS — I509 Heart failure, unspecified: Secondary | ICD-10-CM | POA: Diagnosis not present

## 2016-01-02 DIAGNOSIS — I272 Pulmonary hypertension, unspecified: Secondary | ICD-10-CM | POA: Insufficient documentation

## 2016-01-02 DIAGNOSIS — Z853 Personal history of malignant neoplasm of breast: Secondary | ICD-10-CM | POA: Insufficient documentation

## 2016-01-02 DIAGNOSIS — X58XXXA Exposure to other specified factors, initial encounter: Secondary | ICD-10-CM | POA: Diagnosis not present

## 2016-01-02 DIAGNOSIS — R634 Abnormal weight loss: Secondary | ICD-10-CM | POA: Insufficient documentation

## 2016-01-02 DIAGNOSIS — J309 Allergic rhinitis, unspecified: Secondary | ICD-10-CM | POA: Diagnosis not present

## 2016-01-02 DIAGNOSIS — J42 Unspecified chronic bronchitis: Secondary | ICD-10-CM

## 2016-01-02 LAB — BASIC METABOLIC PANEL
Anion gap: 7 (ref 5–15)
BUN: 11 mg/dL (ref 6–20)
CALCIUM: 10.3 mg/dL (ref 8.9–10.3)
CO2: 24 mmol/L (ref 22–32)
CREATININE: 0.72 mg/dL (ref 0.44–1.00)
Chloride: 108 mmol/L (ref 101–111)
GFR calc Af Amer: >60 mL/min (ref >60)
GLUCOSE: 85 mg/dL (ref 65–99)
POTASSIUM: 4.3 mmol/L (ref 3.5–5.1)
SODIUM: 139 mmol/L (ref 135–145)

## 2016-01-02 LAB — IRON AND TIBC
IRON: 15 ug/dL — AB (ref 28–170)
Saturation Ratios: 4 % — ABNORMAL LOW (ref 10.4–31.8)
TIBC: 353 ug/dL (ref 250–450)
UIBC: 338 ug/dL

## 2016-01-02 LAB — CBC WITH DIFFERENTIAL/PLATELET
BASOS PCT: 1 %
Basophils Absolute: 0 10*3/uL (ref 0.0–0.1)
EOS ABS: 0.2 10*3/uL (ref 0.0–0.7)
Eosinophils Relative: 4 %
HCT: 28.6 % — ABNORMAL LOW (ref 36.0–46.0)
Hemoglobin: 7.9 g/dL — ABNORMAL LOW (ref 12.0–15.0)
LYMPHS ABS: 1.1 10*3/uL (ref 0.7–4.0)
Lymphocytes Relative: 30 %
MCH: 20.5 pg — AB (ref 26.0–34.0)
MCHC: 27.6 g/dL — ABNORMAL LOW (ref 30.0–36.0)
MCV: 74.3 fL — ABNORMAL LOW (ref 78.0–100.0)
MONO ABS: 0.5 10*3/uL (ref 0.1–1.0)
Monocytes Relative: 14 %
NEUTROS PCT: 51 %
Neutro Abs: 2 10*3/uL (ref 1.7–7.7)
PLATELETS: 286 10*3/uL (ref 150–400)
RBC: 3.85 MIL/uL — ABNORMAL LOW (ref 3.87–5.11)
RDW: 24.7 % — AB (ref 11.5–15.5)
WBC: 3.8 10*3/uL — AB (ref 4.0–10.5)

## 2016-01-02 LAB — FOLATE: FOLATE: 8.8 ng/mL (ref >5.9)

## 2016-01-02 LAB — URINALYSIS, ROUTINE W REFLEX MICROSCOPIC
BILIRUBIN URINE: NEGATIVE
GLUCOSE, UA: NEGATIVE mg/dL
HGB URINE DIPSTICK: NEGATIVE
KETONES UR: NEGATIVE mg/dL
Nitrite: NEGATIVE
PROTEIN: NEGATIVE mg/dL
Specific Gravity, Urine: 1.018 (ref 1.005–1.030)
pH: 7 (ref 5.0–8.0)

## 2016-01-02 LAB — URINE MICROSCOPIC-ADD ON

## 2016-01-02 LAB — FERRITIN: Ferritin: 10 ng/mL — ABNORMAL LOW (ref 11–307)

## 2016-01-02 LAB — VITAMIN B12: Vitamin B-12: 248 pg/mL (ref 180–914)

## 2016-01-02 LAB — RETICULOCYTES
RBC.: 3.92 MIL/uL (ref 3.87–5.11)
RETIC COUNT ABSOLUTE: 58.8 10*3/uL (ref 19.0–186.0)
Retic Ct Pct: 1.5 % (ref 0.4–3.1)

## 2016-01-02 LAB — PREPARE RBC (CROSSMATCH)

## 2016-01-02 MED ORDER — METOPROLOL TARTRATE 25 MG PO TABS
25.0000 mg | ORAL_TABLET | Freq: Every day | ORAL | Status: DC | PRN
  Administered 2016-01-03: 25 mg via ORAL
  Filled 2016-01-02: qty 1

## 2016-01-02 MED ORDER — ONDANSETRON HCL 4 MG/2ML IJ SOLN: 4.0000 mg | Freq: Four times a day (QID) | INTRAMUSCULAR | Status: DC | PRN

## 2016-01-02 MED ORDER — FUROSEMIDE 10 MG/ML IJ SOLN
40.0000 mg | Freq: Two times a day (BID) | INTRAMUSCULAR | Status: DC
  Administered 2016-01-02 – 2016-01-03 (×2): 40 mg via INTRAVENOUS
  Filled 2016-01-02 (×2): qty 4

## 2016-01-02 MED ORDER — SODIUM CHLORIDE 0.9 % IV SOLN: 250.0000 mL | INTRAVENOUS | Status: DC | PRN

## 2016-01-02 MED ORDER — SODIUM CHLORIDE 0.9% FLUSH: 3.0000 mL | Freq: Two times a day (BID) | INTRAVENOUS | Status: DC

## 2016-01-02 MED ORDER — MOMETASONE FURO-FORMOTEROL FUM 200-5 MCG/ACT IN AERO
2.0000 | INHALATION_SPRAY | Freq: Two times a day (BID) | RESPIRATORY_TRACT | Status: DC
  Administered 2016-01-03: 2 via RESPIRATORY_TRACT
  Filled 2016-01-02: qty 8.8

## 2016-01-02 MED ORDER — AMIODARONE HCL 200 MG PO TABS
200.0000 mg | ORAL_TABLET | Freq: Every day | ORAL | Status: DC | PRN
  Administered 2016-01-03: 200 mg via ORAL
  Filled 2016-01-02 (×3): qty 1

## 2016-01-02 MED ORDER — TIOTROPIUM BROMIDE MONOHYDRATE 18 MCG IN CAPS
18.0000 ug | ORAL_CAPSULE | Freq: Every day | RESPIRATORY_TRACT | Status: DC
  Filled 2016-01-02: qty 5

## 2016-01-02 MED ORDER — IPRATROPIUM-ALBUTEROL 20-100 MCG/ACT IN AERS: 1.0000 | INHALATION_SPRAY | Freq: Four times a day (QID) | RESPIRATORY_TRACT | Status: DC | PRN

## 2016-01-02 MED ORDER — TRAMADOL HCL 50 MG PO TABS: 50.0000 mg | ORAL_TABLET | Freq: Four times a day (QID) | ORAL | Status: DC | PRN

## 2016-01-02 MED ORDER — TADALAFIL (PAH) 20 MG PO TABS
20.0000 mg | ORAL_TABLET | Freq: Every day | ORAL | Status: DC
  Administered 2016-01-02 – 2016-01-03 (×2): 20 mg via ORAL
  Filled 2016-01-02 (×2): qty 1

## 2016-01-02 MED ORDER — SENNOSIDES-DOCUSATE SODIUM 8.6-50 MG PO TABS: 1.0000 | ORAL_TABLET | Freq: Every evening | ORAL | Status: DC | PRN

## 2016-01-02 MED ORDER — FUROSEMIDE 40 MG PO TABS: 40.0000 mg | ORAL_TABLET | Freq: Two times a day (BID) | ORAL | Status: DC

## 2016-01-02 MED ORDER — ACETAMINOPHEN 650 MG RE SUPP: 650.0000 mg | Freq: Four times a day (QID) | RECTAL | Status: DC | PRN

## 2016-01-02 MED ORDER — ACETAMINOPHEN 325 MG PO TABS: 650.0000 mg | ORAL_TABLET | Freq: Four times a day (QID) | ORAL | Status: DC | PRN

## 2016-01-02 MED ORDER — FLUTICASONE PROPIONATE 50 MCG/ACT NA SUSP: 2.0000 | Freq: Every day | NASAL | Status: DC | PRN

## 2016-01-02 MED ORDER — SODIUM CHLORIDE 0.9% FLUSH: 3.0000 mL | INTRAVENOUS | Status: DC | PRN

## 2016-01-02 MED ORDER — PANTOPRAZOLE SODIUM 40 MG PO TBEC
40.0000 mg | DELAYED_RELEASE_TABLET | Freq: Every day | ORAL | Status: DC | PRN
  Administered 2016-01-03: 40 mg via ORAL
  Filled 2016-01-02: qty 1

## 2016-01-02 MED ORDER — IPRATROPIUM-ALBUTEROL 0.5-2.5 (3) MG/3ML IN SOLN: 3.0000 mL | RESPIRATORY_TRACT | Status: DC | PRN

## 2016-01-02 MED ORDER — ONDANSETRON HCL 4 MG PO TABS: 4.0000 mg | ORAL_TABLET | Freq: Four times a day (QID) | ORAL | Status: DC | PRN

## 2016-01-02 MED ORDER — SODIUM CHLORIDE 0.9 % IV SOLN
10.0000 mL/h | Freq: Once | INTRAVENOUS | Status: AC
  Administered 2016-01-02: 10 mL/h via INTRAVENOUS

## 2016-01-02 MED ORDER — POLYVINYL ALCOHOL 1.4 % OP SOLN
2.0000 [drp] | OPHTHALMIC | Status: DC | PRN
  Filled 2016-01-02: qty 15

## 2016-01-02 NOTE — ED Provider Notes (Signed)
[facility] DEPT Provider Note   CSN: [id number] Arrival date & time: 01/02/16  1005     History   Chief Complaint Chief Complaint  Patient presents with  . Dizziness  . Foot Pain    HPI [name] is a 70 y.o. female.  The history is provided by the patient.  Dizziness  Quality:  Lightheadedness Severity:  Mild Onset quality:  Gradual Duration:  1 week Timing:  Intermittent Progression:  Waxing and waning Context: standing up   Relieved by:  Being still Worsened by:  Standing up Associated symptoms: no blood in stool and no vomiting   Risk factors: anemia   Risk factors: no hx of stroke, no hx of vertigo and no new medications   Risk factors comment:  Missed iron infusion 1 month ago   Past Medical History:  Diagnosis Date  . Allergic rhinitis   . Anemia   . Breast cancer ([place])   . Chronic bronchiolitis ([place])   . Chronic bronchiolitis ([place])   . CVA (cerebral vascular accident) ([place])   . Degenerative lumbar disc   . GERD (gastroesophageal reflux disease)   . Hemorrhage   . OAB (overactive bladder)   . 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 Peroneal neuropathy   . Pulmonary HTN   . Retinal detachment   . Sleep apnea   . SVT (supraventricular tachycardia) ([place])   . Systolic heart failure (H[facility])     There are no active problems to display for this patient.   History reviewed. No pertinent surgical history.  OB History    No data available       Home Medications    Prior to Admission medications   Medication Sig Start Date End Date Taking? Authorizing Provider  albuterol (PROVENTIL HFA;VENTOLIN HFA) 108 (90 Base) MCG/ACT inhaler Inhale 1-2 puffs into the lungs every 6 (six) hours as needed for wheezing or shortness of breath.    Historical Provider, MD  amiodarone (PACERONE) 200 MG tablet Take 200 mg by mouth daily.    Historical Provider, MD  budesonide-formoterol (SYMBICORT) 160-4.5 MCG/ACT inhaler Inhale 2 puffs into the lungs 2  (two) times daily.    Historical Provider, MD  fluticasone (FLONASE) 50 MCG/ACT nasal spray Place 2 sprays into both nostrils daily as needed for allergies.     Historical Provider, MD  furosemide (LASIX) 40 MG tablet Take 40 mg by mouth 2 (two) times daily.     Historical Provider, MD  Ipratropium-Albuterol (COMBIVENT RESPIMAT) 20-100 MCG/ACT AERS respimat Inhale 1 puff into the lungs every 6 (six) hours as needed for wheezing or shortness of breath.     Historical Provider, MD  metoprolol tartrate (LOPRESSOR) 25 MG tablet Take 25 mg by mouth daily as needed (hypertension).     Historical Provider, MD  pantoprazole (PROTONIX) 40 MG tablet Take 40 mg by mouth daily.    Historical Provider, MD  Tadalafil, PAH, (ADCIRCA PO) Take 20 mg by mouth daily.    Historical Provider, MD  tiotropium (SPIRIVA) 18 MCG inhalation capsule Place 18 mcg into inhaler and inhale daily.    Historical Provider, MD    Family History Family History  Problem Relation Age of Onset  . Colon cancer Father     Social History Social History  Substance Use Topics  . Smoking status: Never Smoker  . Smokeless tobacco: Never Used  . Alcohol use No     Allergies   Aspirin   Review of Systems Review of Systems  Gastrointestinal: Negative  for blood in stool and vomiting.  Neurological: Positive for dizziness.  All other systems reviewed and are negative.    Physical Exam Updated Vital Signs BP 140/70 (BP Location: Left Arm)   Pulse 78   Temp 97.5 F (36.4 C) (Oral)   Resp 18   SpO2 96%   Physical Exam  Constitutional: She is oriented to person, place, and time. She appears well-developed and well-nourished. No distress.  HENT:  Head: Normocephalic.  Nose: Nose normal.  Eyes: Conjunctivae are normal.  Neck: Neck supple. No tracheal deviation present.  Cardiovascular: Normal rate and regular rhythm.   Pulmonary/Chest: Effort normal. No respiratory distress.  Abdominal: Soft. She exhibits no  distension.  Neurological: She is alert and oriented to person, place, and time.  Skin: Skin is warm and dry.  Psychiatric: She has a normal mood and affect.     ED Treatments / Results  Labs (all labs ordered are listed, but only abnormal results are displayed) Labs Reviewed  CBC WITH DIFFERENTIAL/PLATELET - Abnormal; Notable for the following:       Result Value   WBC 3.8 (*)    RBC 3.85 (*)    Hemoglobin 7.9 (*)    HCT 28.6 (*)    MCV 74.3 (*)    MCH 20.5 (*)    MCHC 27.6 (*)    RDW 24.7 (*)    All other components within normal limits  URINALYSIS, ROUTINE W REFLEX MICROSCOPIC (NOT AT [facility]) - Abnormal; Notable for the following:    Leukocytes, UA MODERATE (*)    All other components within normal limits  IRON AND TIBC - Abnormal; Notable for the following:    Iron 15 (*)    Saturation Ratios 4 (*)    All other components within normal limits  FERRITIN - Abnormal; Notable for the following:    Ferritin 10 (*)    All other components within normal limits  URINE MICROSCOPIC-ADD ON - Abnormal; Notable for the following:    Squamous Epithelial / LPF 0-5 (*)    Bacteria, UA FEW (*)    All other components within normal limits  BASIC METABOLIC PANEL  VITAMIN 123456  FOLATE  RETICULOCYTES  BASIC METABOLIC PANEL  CBC  TYPE AND SCREEN  PREPARE RBC (CROSSMATCH)    EKG  EKG Interpretation None       Radiology No results found.  Procedures Procedures (including critical care time)  Medications Ordered in ED Medications  amiodarone (PACERONE) tablet 200 mg (not administered)  mometasone-formoterol (DULERA) 200-5 MCG/ACT inhaler 2 puff (2 puffs Inhalation Not Given 01/02/16 1348)  fluticasone (FLONASE) 50 MCG/ACT nasal spray 2 spray (not administered)  ipratropium-albuterol (DUONEB) 0.5-2.5 (3) MG/3ML nebulizer solution 3 mL (not administered)  metoprolol tartrate (LOPRESSOR) tablet 25 mg (not administered)  pantoprazole (PROTONIX) EC tablet 40 mg (not administered)   polyvinyl alcohol (LIQUIFILM TEARS) 1.4 % ophthalmic solution 2 drop (not administered)  tadalafil (PAH) (ADCIRCA) tablet 20 mg (20 mg Oral Given 01/02/16 1731)  tiotropium (SPIRIVA) inhalation capsule 18 mcg (18 mcg Inhalation Not Given 01/02/16 1349)  traMADol (ULTRAM) tablet 50 mg (not administered)  furosemide (LASIX) injection 40 mg (40 mg Intravenous Given 01/02/16 1447)  sodium chloride flush (NS) 0.9 % injection 3 mL (not administered)  sodium chloride flush (NS) 0.9 % injection 3 mL (not administered)  0.9 %  sodium chloride infusion (not administered)  acetaminophen (TYLENOL) tablet 650 mg (not administered)    Or  acetaminophen (TYLENOL) suppository 650 mg (not administered)  ondansetron (ZOFRAN) tablet 4 mg (not administered)    Or  ondansetron (ZOFRAN) injection 4 mg (not administered)  senna-docusate (Senokot-S) tablet 1 tablet (not administered)  0.9 %  sodium chloride infusion (10 mL/hr Intravenous New Bag/Given 01/02/16 1323)     Initial Impression / Assessment and Plan / ED Course  I have reviewed the triage vital signs and the nursing notes.  Pertinent labs & imaging results that were available during my care of the patient were reviewed by me and considered in my medical decision making (see chart for details).  Clinical Course    70 year old female with history of chronic GI bleeding related to Osler Weber or Rendu syndrome presents with lightheadedness and weakness with bilateral leg swelling has occurred over the last week. She missed an iron infusion for anemia earlier last month. Her last hemoglobin was 9.5 and has dropped below 8 today. Plan for admission to further evaluate and treat symptomatic anemia. Hospitalist was consulted for admission and will see the patient in the emergency department.   Final Clinical Impressions(s) / ED Diagnoses   Final diagnoses:  Symptomatic anemia  Chronic lower GI bleeding  Osler-Weber-Rendu syndrome [facility])    New  Prescriptions Current Discharge Medication List       Leo Grosser, MD 01/02/16 1955

## 2016-01-02 NOTE — ED Triage Notes (Addendum)
Pt c/o intermittent lightheadedness and bilateral foot pain and swelling x 1 week.  Pain score 7/10 w/ movement.  Pt reports chronic anemia and sts she missed infusion x 3 weeks.  Pt reports that swelling has decreased in her feet.

## 2016-01-02 NOTE — ED Notes (Signed)
Lab delay - RN to access port.

## 2016-01-02 NOTE — H&P (Signed)
History and Physical    [name] [id number] DOB: 09/22/1945 DOA: 01/02/2016    PCP: Philis Fendt, MD  Patient coming from: home  Chief Complaint: weakness  HPI: [name] is a 70 y.o. 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 No chest pain. Last episode of Palpitations 2 wks ago. She was recently residing in [place] and came back to [place] about 1 mo ago. Was due for Iron infusion a week before she left [place] but has been dragging her feet since getting here and has not gotten any yet. Admits to chronic tarry stools and  nose bleeds.  Previously was following with Dr Marin Olp when here.   ED Course:  Hb 7.9, WBC 3.8- chronic  Review of Systems:  Wt loss about 25 lb in past 3- -4  Months- eats normally ocassional chest pain - feels like gas- "snatches" her at times with walking and makes her bend over Had palpitations about 2 wks ago which did not last long- took her usual medications and they went away Has trouble with ankle edema- pain in b/l legs due to swelling - currently right leg is painful and tight  All other systems reviewed and apart from HPI, are negative.  Past Medical History:  Diagnosis Date  . Allergic rhinitis   . Anemia   . Breast cancer ([place])   . Chronic bronchiolitis ([place])   . Chronic bronchiolitis ([place])   . CVA (cerebral vascular accident) ([place])   . Degenerative lumbar disc   . GERD (gastroesophageal reflux disease)   . Hemorrhage   . OAB (overactive bladder)   . 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 Peroneal neuropathy   . Pulmonary HTN   . Retinal detachment   . Sleep apnea   . SVT (supraventricular tachycardia) ([place])   . Systolic heart failure ([place])     History reviewed. No pertinent surgical history.  Social History:   reports that she has never smoked. She has never used smokeless tobacco. She reports that she does not drink alcohol or use drugs.  Lives with grand daugter- can do ADLs without trouble  Allergies  Allergen Reactions  . Aspirin Other (See Comments)    bleeding  . Latex Itching  . Morphine And Related Nausea And Vomiting  . Other Other (See Comments)    All controlled substances - for fear of becoming addicted    Family History  Problem Relation Age of Onset  . Colon cancer Father      Prior to Admission medications   Medication Sig Start Date End Date Taking? Authorizing Provider  amiodarone (PACERONE) 200 MG tablet Take 200 mg by mouth daily as needed (For high blood pressure.).    Yes Historical Provider, MD  budesonide-formoterol (SYMBICORT) 160-4.5 MCG/ACT inhaler Inhale 2 puffs into the lungs 2 (two) times daily.   Yes Historical Provider, MD  fluticasone (FLONASE) 50 MCG/ACT nasal spray Place 2 sprays into both nostrils daily as needed for allergies.    Yes Historical Provider, MD  furosemide (LASIX) 40 MG tablet Take 40 mg by mouth 2 (two) times daily.    Yes Historical Provider, MD  Ipratropium-Albuterol (COMBIVENT RESPIMAT) 20-100 MCG/ACT AERS respimat Inhale 1 puff into the lungs every 6 (six) hours as needed for wheezing or shortness of breath.    Yes Historical Provider, MD  ipratropium-albuterol (DUONEB) 0.5-2.5 (3) MG/3ML SOLN Take 3 mLs by nebulization every 4 (four) hours as needed (For wheezing or shortness of breath.).   Yes Historical Provider, MD  metoprolol tartrate (LOPRESSOR) 25 MG tablet Take 25 mg by mouth daily as needed (For hypertension.).    Yes Historical Provider, MD  pantoprazole (PROTONIX) 40 MG tablet Take 40 mg by mouth daily as needed (For heartburn or acid reflux.).    Yes Historical Provider, MD  polyvinyl alcohol (LIQUIFILM TEARS) 1.4 % ophthalmic solution Place 2 drops into both eyes as needed for dry eyes.   Yes Historical Provider, MD    tadalafil, PAH, (ADCIRCA) 20 MG tablet Take 20 mg by mouth daily.   Yes Historical Provider, MD  tiotropium (SPIRIVA) 18 MCG inhalation capsule Place 18 mcg into inhaler and inhale daily.   Yes Historical Provider, MD  traMADol (ULTRAM) 50 MG tablet Take 50 mg by mouth every 6 (six) hours as needed (For back spasms.).   Yes Historical Provider, MD    Physical Exam: Vitals:   01/02/16 1011 01/02/16 1206 01/02/16 1324  BP: 140/70 139/67 146/76  Pulse: 78 74 77  Resp: 18 18 18   Temp: 97.5 F (36.4 C)    TempSrc: Oral    SpO2: 96% 95% 99%      Constitutional: NAD, calm, comfortable Eyes: PERTLA, lids and conjunctivae normal ENMT: Mucous membranes are moist. Posterior pharynx clear of any exudate or lesions. Normal dentition.  Neck: normal, supple, no masses, no thyromegaly Respiratory: clear to auscultation bilaterally, no wheezing, no crackles. Normal respiratory effort. No accessory muscle use.  Cardiovascular: S1 & S2 heard, regular rate and rhythm, 2/6 murmur at [place]- no rubs / gallops. Mild LE extremity edema. 2+ pedal pulses. No carotid bruits.  Abdomen: No distension, no tenderness, no masses palpated. No hepatosplenomegaly. Bowel sounds normal.  Musculoskeletal: no clubbing / cyanosis. No joint deformity upper and lower extremities. Good ROM, no contractures. Normal muscle tone.  Skin: no rashes, lesions, ulcers. No induration Neurologic: CN 2-12 grossly intact. Sensation intact, DTR normal. Strength 5/5 in all 4 limbs.  Psychiatric: Normal judgment and insight. Alert and oriented x 3. Normal mood.     Labs on Admission: I have personally reviewed following labs and imaging studies  CBC:  Recent Labs Lab 01/02/16 1041  WBC 3.8*  NEUTROABS 2.0  HGB 7.9*  HCT 28.6*  MCV 74.3*  PLT [id number]   Basic Metabolic Panel:  Recent Labs Lab 01/02/16 1041  NA 139  K 4.3  CL 108  CO2 24  GLUCOSE 85  BUN 11  CREATININE 0.72  CALCIUM 10.3   GFR: Estimated Creatinine  Clearance: 68.3 mL/min (by C-G formula based on SCr of 0.72 mg/dL). Liver Function Tests: No results for input(s): AST, ALT, ALKPHOS, BILITOT, PROT, ALBUMIN in the last 168 hours. No results for input(s): LIPASE, AMYLASE in the last 168 hours. No results for input(s): AMMONIA in the last 168 hours. Coagulation Profile: No results for input(s): INR, PROTIME in the last 168 hours. Cardiac Enzymes: No results for input(s): CKTOTAL, CKMB, CKMBINDEX, TROPONINI in the last 168 hours. BNP (last 3 results) No results for input(s): PROBNP in the last 8760 hours. HbA1C: No results for input(s): HGBA1C in the last 72 hours. CBG: No results for input(s): GLUCAP in the last 168 hours. Lipid Profile: No results for input(s): CHOL, HDL, LDLCALC, TRIG, CHOLHDL, LDLDIRECT in the last 72 hours. Thyroid Function Tests: No results for input(s): TSH, T4TOTAL, FREET4, T3FREE, THYROIDAB in  the last 72 hours. Anemia Panel: No results for input(s): VITAMINB12, FOLATE, FERRITIN, TIBC, IRON, RETICCTPCT in the last 72 hours. Urine analysis:    Component Value Date/Time   COLORURINE YELLOW 01/02/2016 [place] 01/02/2016 1205   LABSPEC 1.018 01/02/2016 1205   PHURINE 7.0 01/02/2016 1205   GLUCOSEU NEGATIVE 01/02/2016 1205   HGBUR NEGATIVE 01/02/2016 1205   [place] 01/02/2016 1205   [place] 01/02/2016 1205   PROTEINUR NEGATIVE 01/02/2016 1205   UROBILINOGEN 0.2 09/13/2008 1445   NITRITE NEGATIVE 01/02/2016 1205   LEUKOCYTESUR MODERATE (A) 01/02/2016 1205   Sepsis Labs: @LABRCNTIP (procalcitonin:4,lacticidven:4) )No results found for this or any previous visit (from the past 240 hour(s)).   Radiological Exams on Admission: No results found.     Assessment/Plan Active Problems:   Symptomatic anemia- due to chronic blood loss - Hb 10/13 was 9.5- now 7.9 -  transfuse 1 U PRBC - check Iron levels and replace with IV Iron if needed- takes daily oral Iron at home  which is not mentioned on home med list - she knows to f/u with Dr Marin Olp- he is out of town currently  Osler- Weber- Rendu disease  CHF, unspecified/ pedal edema - she states her last doctor states she has the kind that will not kill her - place on IV lasix for now as she feels legs are tight and she is going to be receiving blood today  H/o SVT - cont Amiodarone & Metoprolol   chronic bronchitis - cont inhalers and Nebs      DVT prophylaxis: SCDs Code Status: Partial code - does not want to be intubated- does not want an NG tube  Family Communication:   Disposition Plan: med/surg - likely home tomorrow Consults called:   Admission status: observation    [facility] MD [facility] Pager: [email or web address] Password [email or web address] 7PM-7AM, please contact night-coverage   01/02/2016, 1:26 PM

## 2016-01-03 DIAGNOSIS — D500 Iron deficiency anemia secondary to blood loss (chronic): Secondary | ICD-10-CM | POA: Diagnosis not present

## 2016-01-03 DIAGNOSIS — I780 Hereditary hemorrhagic telangiectasia: Secondary | ICD-10-CM | POA: Diagnosis not present

## 2016-01-03 DIAGNOSIS — D649 Anemia, unspecified: Secondary | ICD-10-CM | POA: Diagnosis not present

## 2016-01-03 LAB — BASIC METABOLIC PANEL
Anion gap: 6 (ref 5–15)
BUN: 11 mg/dL (ref 6–20)
CALCIUM: 10.2 mg/dL (ref 8.9–10.3)
CO2: 28 mmol/L (ref 22–32)
Chloride: 107 mmol/L (ref 101–111)
Creatinine, Ser: 0.79 mg/dL (ref 0.44–1.00)
GFR calc Af Amer: >60 mL/min (ref >60)
Glucose, Bld: 100 mg/dL — ABNORMAL HIGH (ref 65–99)
POTASSIUM: 3.4 mmol/L — AB (ref 3.5–5.1)
SODIUM: 141 mmol/L (ref 135–145)

## 2016-01-03 LAB — TYPE AND SCREEN
ABO/RH(D): A POS
Antibody Screen: NEGATIVE
Unit division: 0

## 2016-01-03 LAB — CBC
HEMATOCRIT: 29.7 % — AB (ref 36.0–46.0)
Hemoglobin: 8.5 g/dL — ABNORMAL LOW (ref 12.0–15.0)
MCH: 21.6 pg — ABNORMAL LOW (ref 26.0–34.0)
MCHC: 28.6 g/dL — AB (ref 30.0–36.0)
MCV: 75.6 fL — ABNORMAL LOW (ref 78.0–100.0)
PLATELETS: 297 10*3/uL (ref 150–400)
RBC: 3.93 MIL/uL (ref 3.87–5.11)
RDW: 24.1 % — AB (ref 11.5–15.5)
WBC: 4.4 10*3/uL (ref 4.0–10.5)

## 2016-01-03 MED ORDER — VITAMINS A & D EX OINT
TOPICAL_OINTMENT | CUTANEOUS | Status: AC
  Administered 2016-01-03: 11:00:00
  Filled 2016-01-03: qty 5

## 2016-01-03 MED ORDER — SODIUM CHLORIDE 0.9% FLUSH
10.0000 mL | INTRAVENOUS | Status: DC | PRN
  Administered 2016-01-03: 30 mL
  Administered 2016-01-03: 10 mL
  Filled 2016-01-03: qty 40

## 2016-01-03 MED ORDER — SODIUM CHLORIDE 0.9 % IV SOLN
25.0000 mg | Freq: Once | INTRAVENOUS | Status: DC
  Filled 2016-01-03: qty 2

## 2016-01-03 MED ORDER — HEPARIN SOD (PORK) LOCK FLUSH 100 UNIT/ML IV SOLN
500.0000 [IU] | INTRAVENOUS | Status: AC | PRN
  Administered 2016-01-03: 500 [IU]

## 2016-01-03 MED ORDER — ALTEPLASE 2 MG IJ SOLR
2.0000 mg | Freq: Once | INTRAMUSCULAR | Status: AC
  Administered 2016-01-03: 2 mg
  Filled 2016-01-03: qty 2

## 2016-01-03 MED ORDER — SODIUM CHLORIDE 0.9 % IV SOLN
250.0000 mg | Freq: Every day | INTRAVENOUS | Status: DC
  Administered 2016-01-03: 250 mg via INTRAVENOUS
  Filled 2016-01-03: qty 20

## 2016-01-03 NOTE — Discharge Summary (Addendum)
Physician Discharge Summary  [name] [id number] DOB: 1945-11-23 DOA: 01/02/2016  PCP: Philis Fendt, MD  Admit date: 01/02/2016 Discharge date: 01/03/2016  Admitted From:Home Disposition: Home  Recommendations for Outpatient Follow-up:  1. Follow up with PCP in 1-2 weeks 2. Please obtain BMP/CBC in one week  Home Health: No Equipment/Devices: No  Discharge Condition: Stable CODE STATUS: Full Diet recommendation: Heart healthy  Brief/Interim Summary: 70 y.o. 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 Patient with no chest pain.  #Symptomatic anemia due to chronic blood loss (Iron deficiency anemia ) in the setting of known Osler-Weber-Rendu syndrome: Patient reported that she gets frequent blood transfusion and iron transfusion as an outpatient. Her hemoglobin about 10 days ago was 9.5 but on admission she was found to have hemoglobin of 7.9 with symptoms of weakness. Patient was treated with 1 unit of red blood cell transfusion and IV iron. Patient tolerated well. Hemoglobin improved. Patient's symptoms improved. Patient reported that she follows up with her hematologist Dr. Marin Olp. Patient denied headache, dizziness, weakness, chest pain or shortness of breath. No nausea vomiting or bleeding. Reported she has chronic mild epistaxis. Patient is eager to go home today and requested to be released. Patient is discharged home in a stable condition with outpatient follow-up.  On admission patient was treated with IV Lasix for congestive heart failure and pedal edema. The type unknown. During my examination today patient has no lower extremity edema and she looks euvolemic therefore I doubt if she has acute CHF.  I advised patient to continue current home medications and follow-up with her PCP and  hematologist outpatient. Patient verbalized of follow-up instructions.  Discharge Diagnoses:  Active Problems:   Symptomatic anemia   Osler-Weber-Rendu syndrome (HCC)   CHF (congestive heart failure), NYHA class II, unspecified failure chronicity, unspecified type (HCC)   Chronic bronchitis (HCC)   SVT (supraventricular tachycardia) (HCC)   Anemia due to chronic blood loss    Discharge Instructions  Discharge Instructions    Call MD for:  difficulty breathing, headache or visual disturbances    Complete by:  As directed    Call MD for:  persistant dizziness or light-headedness    Complete by:  As directed    Call MD for:  persistant nausea and vomiting    Complete by:  As directed    Call MD for:  temperature >100.4    Complete by:  As directed    Diet - low sodium heart healthy    Complete by:  As directed    Discharge instructions    Complete by:  As directed    Please follow up with your PCP and hematologist.   Increase activity slowly    Complete by:  As directed        Medication List    TAKE these medications   ADCIRCA 20 MG tablet Generic drug:  tadalafil (PAH) Take 20 mg by mouth daily.   amiodarone 200 MG tablet Commonly known as:  PACERONE Take 200 mg by mouth daily as needed (For high blood pressure.).   budesonide-formoterol 160-4.5 MCG/ACT inhaler Commonly known as:  SYMBICORT Inhale 2 puffs into the lungs 2 (two) times daily.   ipratropium-albuterol 0.5-2.5 (3) MG/3ML Soln Commonly known as:  DUONEB Take 3 mLs by nebulization every 4 (four) hours as needed (For wheezing or shortness of breath.).   COMBIVENT  RESPIMAT 20-100 MCG/ACT Aers respimat Generic drug:  Ipratropium-Albuterol Inhale 1 puff into the lungs every 6 (six) hours as needed for wheezing or shortness of breath.   fluticasone 50 MCG/ACT nasal spray Commonly known as:  FLONASE Place 2 sprays into both nostrils daily as needed for allergies.   furosemide 40 MG tablet Commonly  known as:  LASIX Take 40 mg by mouth 2 (two) times daily.   metoprolol tartrate 25 MG tablet Commonly known as:  LOPRESSOR Take 25 mg by mouth daily as needed (For hypertension.).   pantoprazole 40 MG tablet Commonly known as:  PROTONIX Take 40 mg by mouth daily as needed (For heartburn or acid reflux.).   polyvinyl alcohol 1.4 % ophthalmic solution Commonly known as:  LIQUIFILM TEARS Place 2 drops into both eyes as needed for dry eyes.   tiotropium 18 MCG inhalation capsule Commonly known as:  SPIRIVA Place 18 mcg into inhaler and inhale daily.   traMADol 50 MG tablet Commonly known as:  ULTRAM Take 50 mg by mouth every 6 (six) hours as needed (For back spasms.).      Follow-up Information    Philis Fendt, MD. Schedule an appointment as soon as possible for a visit in 1 week(s).   Specialty:  Internal Medicine Contact information: [address] [place] [address] [phone]        Volanda Napoleon, MD. Schedule an appointment as soon as possible for a visit in 2 week(s).   Specialty:  Oncology Contact information: [place] [address] [phone]          Allergies  Allergen Reactions  . Aspirin Other (See Comments)    bleeding  . Latex Itching  . Morphine And Related Nausea And Vomiting  . Other Other (See Comments)    All controlled substances - for fear of becoming addicted    Consultations:  None   Procedures/Studies: Dg Chest 2 View  Result Date: 12/22/2015 CLINICAL DATA:  Shortness of breath, leg swelling. EXAM: CHEST  2 VIEW COMPARISON:  AP view 09/12/2008 FINDINGS: Tip of the right chest port in the mid SVC. Moderate cardiomegaly. Tortuous thoracic aorta. Postsurgical change in the right hemithorax with chain sutures at the right hilum and scarring. Coils project in the region of the left lower lobe. Mild vascular congestion without alveolar edema. No pleural fluid. No pneumothorax. Suspect prior  lumpectomy on the right. No acute osseous abnormalities seen. Remote midshaft left clavicle fracture. IMPRESSION: Cardiomegaly with vascular congestion. Postsurgical change in the right hemithorax. Coils in the left lower lobe. Electronically Signed   By: Jeb Levering M.D.   On: 12/22/2015 02:28      Subjective: Patient was seen and examined at bedside. Patient reported feeling much better and requested to be discharged from the hospital today. Patient denied fever, chills, headache, dizziness, blindness, nausea, vomiting, chest pain, shortness of breath. No abdominal pain. Patient's granddaughter at bedside.   Discharge Exam: Vitals:   01/03/16 0545 01/03/16 0859  BP: 123/67   Pulse: 79 80  Resp: 20 18  Temp: 98.2 F (36.8 C)    Vitals:   01/02/16 1849 01/02/16 2048 01/03/16 0545 01/03/16 0859  BP: 138/66 135/70 123/67   Pulse: 70 74 79 80  Resp: 18 18 20 18   Temp: 98.4 F (36.9 C) 97.8 F (36.6 C) 98.2 F (36.8 C)   TempSrc: Oral Oral Oral   SpO2: 100% 97% 95%   Weight:  Height:        General: Pt is alert, awake, not in acute distress Cardiovascular: RRR, S1/S2 +, no rubs, no gallops Respiratory: CTA bilaterally, no wheezing, no rhonchi Abdominal: Soft, NT, ND, bowel sounds + Extremities: no edema, no cyanosis    The results of significant diagnostics from this hospitalization (including imaging, microbiology, ancillary and laboratory) are listed below for reference.     Microbiology: No results found for this or any previous visit (from the past 240 hour(s)).   Labs: BNP (last 3 results)  Recent Labs  12/22/15 0654  BNP [id number]*   Basic Metabolic Panel:  Recent Labs Lab 01/02/16 1041 01/03/16 1025  NA 139 141  K 4.3 3.4*  CL 108 107  CO2 24 28  GLUCOSE 85 100*  BUN 11 11  CREATININE 0.72 0.79  CALCIUM 10.3 10.2   Liver Function Tests: No results for input(s): AST, ALT, ALKPHOS, BILITOT, PROT, ALBUMIN in the last 168 hours. No results  for input(s): LIPASE, AMYLASE in the last 168 hours. No results for input(s): AMMONIA in the last 168 hours. CBC:  Recent Labs Lab 01/02/16 1041 01/03/16 1025  WBC 3.8* 4.4  NEUTROABS 2.0  --   HGB 7.9* 8.5*  HCT 28.6* 29.7*  MCV 74.3* 75.6*  PLT 286 297   Cardiac Enzymes: No results for input(s): CKTOTAL, CKMB, CKMBINDEX, TROPONINI in the last 168 hours. BNP: Invalid input(s): POCBNP CBG: No results for input(s): GLUCAP in the last 168 hours. D-Dimer No results for input(s): DDIMER in the last 72 hours. Hgb A1c No results for input(s): HGBA1C in the last 72 hours. Lipid Profile No results for input(s): CHOL, HDL, LDLCALC, TRIG, CHOLHDL, LDLDIRECT in the last 72 hours. Thyroid function studies No results for input(s): TSH, T4TOTAL, T3FREE, THYROIDAB in the last 72 hours.  Invalid input(s): FREET3 Anemia work up  Recent Labs  01/02/16 1334  VITAMINB12 248  FOLATE 8.8  FERRITIN 10*  TIBC 353  IRON 15*  RETICCTPCT 1.5   Urinalysis    Component Value Date/Time   COLORURINE YELLOW 01/02/2016 [place] 01/02/2016 1205   LABSPEC 1.018 01/02/2016 1205   PHURINE 7.0 01/02/2016 [place] 01/02/2016 1205   [place] 01/02/2016 1205   [place] 01/02/2016 1205   [place] 01/02/2016 1205   PROTEINUR NEGATIVE 01/02/2016 1205   UROBILINOGEN 0.2 09/13/2008 1445   NITRITE NEGATIVE 01/02/2016 1205   LEUKOCYTESUR MODERATE (A) 01/02/2016 1205   Sepsis Labs Invalid input(s): PROCALCITONIN,  WBC,  LACTICIDVEN Microbiology No results found for this or any previous visit (from the past 240 hour(s)).   Time coordinating discharge: Over 30 minutes  SIGNED:   Rosita Fire, MD  [facility] 01/03/2016, 1:32 PM Pager   If 7PM-7AM, please contact night-coverage [email or web address] Password [email or web address]

## 2016-01-03 NOTE — Care Management Obs Status (Signed)
[place] NOTIFICATION   Patient Details  Name: [name] MRN: [id number] Date of Birth: 22-Mar-1945   Medicare Observation Status Notification Given:  Yes    Leeroy Cha, RN 01/03/2016, 3:25 PM

## 2016-01-21 ENCOUNTER — Emergency Department (HOSPITAL_COMMUNITY)
Admission: EM | Admit: 2016-01-21 | Discharge: 2016-01-21 | Disposition: A | Discharge status: Home / Self Care | Payer: Medicare Other | Attending: Emergency Medicine | Admitting: Emergency Medicine

## 2016-01-21 ENCOUNTER — Emergency Department (HOSPITAL_COMMUNITY): Payer: Medicare Other

## 2016-01-21 ENCOUNTER — Encounter (HOSPITAL_COMMUNITY): Payer: Self-pay

## 2016-01-21 DIAGNOSIS — Z91040 Latex allergy status: Secondary | ICD-10-CM | POA: Insufficient documentation

## 2016-01-21 DIAGNOSIS — Z8673 Personal history of transient ischemic attack (TIA), and cerebral infarction without residual deficits: Secondary | ICD-10-CM | POA: Diagnosis not present

## 2016-01-21 DIAGNOSIS — I5020 Unspecified systolic (congestive) heart failure: Secondary | ICD-10-CM | POA: Diagnosis not present

## 2016-01-21 DIAGNOSIS — Z853 Personal history of malignant neoplasm of breast: Secondary | ICD-10-CM | POA: Insufficient documentation

## 2016-01-21 DIAGNOSIS — Z79899 Other long term (current) drug therapy: Secondary | ICD-10-CM | POA: Insufficient documentation

## 2016-01-21 DIAGNOSIS — R05 Cough: Secondary | ICD-10-CM | POA: Insufficient documentation

## 2016-01-21 DIAGNOSIS — R059 Cough, unspecified: Secondary | ICD-10-CM

## 2016-01-21 MED ORDER — AZITHROMYCIN 250 MG PO TABS: ORAL_TABLET | ORAL | 0 refills | Status: DC

## 2016-01-21 NOTE — ED Triage Notes (Signed)
Pt with cough x 1 week.  No fever.  Productive.

## 2016-01-21 NOTE — Discharge Instructions (Signed)
Follow up with your md this week if not improving. 

## 2016-01-21 NOTE — ED Provider Notes (Signed)
[facility] DEPT Provider Note   CSN: [id number] Arrival date & time: 01/21/16  1347     History   Chief Complaint Chief Complaint  Patient presents with  . Cough    HPI [name] is a 70 y.o. female.  Pt with productive cough   The history is provided by the patient. No language interpreter was used.  Cough  This is a new problem. The current episode started more than 2 days ago. The problem occurs constantly. The problem has not changed since onset.The cough is productive of brown sputum. There has been no fever. Pertinent negatives include no chest pain and no headaches.    Past Medical History:  Diagnosis Date  . Allergic rhinitis   . Anemia   . Breast cancer ([place])   . Chronic bronchiolitis ([place])   . Chronic bronchiolitis ([place])   . CVA (cerebral vascular accident) ([place])   . Degenerative lumbar disc   . GERD (gastroesophageal reflux disease)   . Hemorrhage   . OAB (overactive bladder)   . 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 Peroneal neuropathy   . Pulmonary HTN   . Retinal detachment   . Sleep apnea   . SVT (supraventricular tachycardia) ([place])   . Systolic heart failure [facility])     Patient Active Problem List   Diagnosis Date Noted  . Symptomatic anemia 01/02/2016  . 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 CHF (congestive heart failure), NYHA class II, unspecified failure chronicity, unspecified type ([place]) 01/02/2016  . Chronic bronchitis ([place]) 01/02/2016  . SVT (supraventricular tachycardia) ([place]) 01/02/2016  . Anemia due to chronic blood loss 01/02/2016    History reviewed. No pertinent surgical history.  OB History    No data available       Home Medications    Prior to Admission medications   Medication Sig Start Date End Date Taking? Authorizing Provider  amiodarone (PACERONE) 200 MG tablet Take 200 mg by mouth daily as needed (For high blood pressure.).     Historical Provider, MD  azithromycin (ZITHROMAX Z-PAK) 250  MG tablet 2 po day one, then 1 daily x 4 days 01/21/16   Milton Ferguson, MD  budesonide-formoterol [facility]) 160-4.5 MCG/ACT inhaler Inhale 2 puffs into the lungs 2 (two) times daily.    Historical Provider, MD  fluticasone (FLONASE) 50 MCG/ACT nasal spray Place 2 sprays into both nostrils daily as needed for allergies.     Historical Provider, MD  furosemide (LASIX) 40 MG tablet Take 40 mg by mouth 2 (two) times daily.     Historical Provider, MD  Ipratropium-Albuterol (COMBIVENT RESPIMAT) 20-100 MCG/ACT AERS respimat Inhale 1 puff into the lungs every 6 (six) hours as needed for wheezing or shortness of breath.     Historical Provider, MD  ipratropium-albuterol (DUONEB) 0.5-2.5 (3) MG/3ML SOLN Take 3 mLs by nebulization every 4 (four) hours as needed (For wheezing or shortness of breath.).    Historical Provider, MD  metoprolol tartrate (LOPRESSOR) 25 MG tablet Take 25 mg by mouth daily as needed (For hypertension.).     Historical Provider, MD  pantoprazole (PROTONIX) 40 MG tablet Take 40 mg by mouth daily as needed (For heartburn or acid reflux.).     Historical Provider, MD  polyvinyl alcohol (LIQUIFILM TEARS) 1.4 % ophthalmic solution Place 2 drops into both eyes as needed for dry eyes.    Historical Provider, MD  tadalafil, PAH, (ADCIRCA) 20 MG tablet Take 20 mg by mouth daily.    Historical Provider, MD  tiotropium (SPIRIVA) 18 MCG inhalation capsule Place 18 mcg into inhaler and inhale daily.    Historical Provider, MD  traMADol (ULTRAM) 50 MG tablet Take 50 mg by mouth every 6 (six) hours as needed (For back spasms.).    Historical Provider, MD    Family History Family History  Problem Relation Age of Onset  . Colon cancer Father     Social History Social History  Substance Use Topics  . Smoking status: Never Smoker  . Smokeless tobacco: Never Used  . Alcohol use No     Allergies   Aspirin; Latex; Morphine and related; and Other   Review of Systems Review of Systems    Constitutional: Negative for appetite change and fatigue.  HENT: Negative for congestion, ear discharge and sinus pressure.   Eyes: Negative for discharge.  Respiratory: Positive for cough.   Cardiovascular: Negative for chest pain.  Gastrointestinal: Negative for abdominal pain and diarrhea.  Genitourinary: Negative for frequency and hematuria.  Musculoskeletal: Negative for back pain.  Skin: Negative for rash.  Neurological: Negative for seizures and headaches.  Psychiatric/Behavioral: Negative for hallucinations.     Physical Exam Updated Vital Signs BP 154/75 (BP Location: Left Arm)   Pulse 62   Temp 97.5 F (36.4 C) (Oral)   Resp 20   SpO2 99%   Physical Exam  Constitutional: She is oriented to person, place, and time. She appears well-developed.  HENT:  Head: Normocephalic.  Eyes: Conjunctivae and EOM are normal. No scleral icterus.  Neck: Neck supple. No thyromegaly present.  Cardiovascular: Normal rate and regular rhythm.  Exam reveals no gallop and no friction rub.   No murmur heard. Pulmonary/Chest: No stridor. She has no wheezes. She has no rales. She exhibits no tenderness.  Abdominal: She exhibits no distension. There is no tenderness. There is no rebound.  Musculoskeletal: Normal range of motion. She exhibits no edema.  Lymphadenopathy:    She has no cervical adenopathy.  Neurological: She is oriented to person, place, and time. She exhibits normal muscle tone. Coordination normal.  Skin: No rash noted. No erythema.  Psychiatric: She has a normal mood and affect. Her behavior is normal.     ED Treatments / Results  Labs (all labs ordered are listed, but only abnormal results are displayed) Labs Reviewed - No data to display  EKG  EKG Interpretation None       Radiology Dg Chest 2 View  Result Date: 01/21/2016 CLINICAL DATA:  Cough 4 days.  Breast cancer EXAM: CHEST  2 VIEW COMPARISON:  12/22/2015 FINDINGS: RIGHT-sided port noted. Stable  enlarged cardiac silhouette. The central venous congestion is improved compared to prior. No pleural fluid. No focal infiltrate. Stable Post procedural findings in LEFT and RIGHT lung. IMPRESSION: Cardiomegaly and mild central venous congestion. No evidence pulmonary edema or infiltrate. Electronically Signed   By: Suzy Bouchard M.D.   On: 01/21/2016 14:32    Procedures Procedures (including critical care time)  Medications Ordered in ED Medications - No data to display   Initial Impression / Assessment and Plan / ED Course  I have reviewed the triage vital signs and the nursing notes.  Pertinent labs & imaging results that were available during my care of the patient were reviewed by me and considered in my medical decision making (see chart for details).  Clinical Course   bronchtis.   tx with zpak and follow up with pcp  Final Clinical Impressions(s) / ED Diagnoses   Final diagnoses:  Cough  New Prescriptions New Prescriptions   AZITHROMYCIN (ZITHROMAX Z-PAK) 250 MG TABLET    2 po day one, then 1 daily x 4 days     Milton Ferguson, MD 01/21/16 1513

## 2016-01-25 ENCOUNTER — Ambulatory Visit: Payer: Medicare Other

## 2016-01-25 ENCOUNTER — Ambulatory Visit (HOSPITAL_BASED_OUTPATIENT_CLINIC_OR_DEPARTMENT_OTHER): Payer: Medicare Other | Admitting: Hematology & Oncology

## 2016-01-25 ENCOUNTER — Other Ambulatory Visit (HOSPITAL_BASED_OUTPATIENT_CLINIC_OR_DEPARTMENT_OTHER): Payer: Medicare Other

## 2016-01-25 VITALS — BP 124/61 | HR 62 | Temp 98.0°F | Resp 18 | Wt 165.0 lb

## 2016-01-25 DIAGNOSIS — I780 Hereditary hemorrhagic telangiectasia: Secondary | ICD-10-CM

## 2016-01-25 DIAGNOSIS — Z853 Personal history of malignant neoplasm of breast: Secondary | ICD-10-CM

## 2016-01-25 DIAGNOSIS — D509 Iron deficiency anemia, unspecified: Secondary | ICD-10-CM

## 2016-01-25 DIAGNOSIS — D500 Iron deficiency anemia secondary to blood loss (chronic): Secondary | ICD-10-CM

## 2016-01-25 LAB — CBC WITH DIFFERENTIAL (CANCER CENTER ONLY)
BASO#: 0.1 10*3/uL (ref 0.0–0.2)
BASO%: 1.2 % (ref 0.0–2.0)
EOS ABS: 0.2 10*3/uL (ref 0.0–0.5)
EOS%: 4.1 % (ref 0.0–7.0)
HCT: 28.9 % — ABNORMAL LOW (ref 34.8–46.6)
HGB: 8.1 g/dL — ABNORMAL LOW (ref 11.6–15.9)
LYMPH#: 1.2 10*3/uL (ref 0.9–3.3)
LYMPH%: 29.7 % (ref 14.0–48.0)
MCH: 20.8 pg — AB (ref 26.0–34.0)
MCHC: 28 g/dL — ABNORMAL LOW (ref 32.0–36.0)
MCV: 74 fL — AB (ref 81–101)
MONO#: 0.7 10*3/uL (ref 0.1–0.9)
MONO%: 15.8 % — AB (ref 0.0–13.0)
NEUT#: 2 10*3/uL (ref 1.5–6.5)
NEUT%: 49.2 % (ref 39.6–80.0)
PLATELETS: 324 10*3/uL (ref 145–400)
RBC: 3.9 10*6/uL (ref 3.70–5.32)
RDW: 23.5 % — AB (ref 11.1–15.7)
WBC: 4.1 10*3/uL (ref 3.9–10.0)

## 2016-01-25 LAB — CMP (CANCER CENTER ONLY)
ALBUMIN: 3.4 g/dL (ref 3.3–5.5)
ALT(SGPT): 24 U/L (ref 10–47)
AST: 25 U/L (ref 11–38)
Alkaline Phosphatase: 160 U/L — ABNORMAL HIGH (ref 26–84)
BUN, Bld: 8 mg/dL (ref 7–22)
CALCIUM: 10.6 mg/dL — AB (ref 8.0–10.3)
CO2: 28 meq/L (ref 18–33)
Chloride: 107 mEq/L (ref 98–108)
Creat: 0.9 mg/dl (ref 0.6–1.2)
GLUCOSE: 85 mg/dL (ref 73–118)
POTASSIUM: 3.8 meq/L (ref 3.3–4.7)
Sodium: 141 mEq/L (ref 128–145)
Total Bilirubin: 0.6 mg/dl (ref 0.20–1.60)
Total Protein: 7.1 g/dL (ref 6.4–8.1)

## 2016-01-25 LAB — TECHNOLOGIST REVIEW CHCC SATELLITE

## 2016-01-25 LAB — CHCC SATELLITE - SMEAR

## 2016-01-25 NOTE — Progress Notes (Signed)
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 : I am back in town and needs to see my doctors again.  HPI: [name] is well-known to me. I'm not seeing her for about 7 or 8 years. She is a very charming 70 year old African-American female. She has hereditary hemorrhagic telangiectasias. We have treated her in the past with iron. I think last him that she got iron for me was back in September 2010.  She subsequently moved to [place], [place]. She has been seen down there by several doctors in [place].. 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 She's been told that she has heart failure. She's been told that she could easily die because of bleeding. I'm not sure where all this came from.  She has moved back up to [place]. She saw her family doctor. She apparently was given iron and a blood transfusion back in late October. She was weak. She was anemic. She had a hemoglobin of 7.9.  She does feel a little better. She has have leg swelling. I told her this probably is from chronic anemia.  She was told she had heart failure. Again,, sure how they made that diagnosis. The last echocardiogram that I see on her is from 2008.  Her iron studies back in mid October when she was admitted showed a ferritin of 10. Her iron saturation was 4.  Otherwise, she seems to be doing okay. She looks okay.. She had a history of breast cancer probably 16 or 17 years ago. This was a stage I ductal carcinoma of the right breast. She had a lumpectomy and radiation. She was on tamoxifen for 5 years.  She has some occasional abdominal pain. She has some occasional hemoptysis.  She was seen by a pulmonologist at some point. She was told she has pulmonary hypertension. She is on tadalafil for this.  She does not smoke. She does not drink..  She has had some  occasional epistaxis. She has had some melena. There's been no hematochezia. She's had no hematuria. There's been no hematemesis.  I am glad to see her. She saw her has a lot going on.  Overall, her performance status is ECOG 1-2.     Past Medical History:  Diagnosis Date  . Allergic rhinitis   . Anemia   . Breast cancer ([place])   . Chronic bronchiolitis ([place])   . Chronic bronchiolitis ([place])   . CVA (cerebral vascular accident) ([place])   . Degenerative lumbar disc   . GERD (gastroesophageal reflux disease)   . Hemorrhage   . OAB (overactive bladder)   . 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 Peroneal neuropathy   . Pulmonary HTN   . Retinal detachment   . Sleep apnea   . SVT (supraventricular tachycardia) ([employer])   . Systolic heart failure ([facility])   :  No past surgical history on file.:   Current Outpatient Prescriptions:  .  amiodarone (PACERONE) 200 MG tablet, Take 200 mg by mouth daily as needed (For high blood pressure.). , Disp: , Rfl:  .  azithromycin (ZITHROMAX Z-PAK) 250 MG tablet, 2 po day one, then 1 daily x 4 days, Disp: 6 tablet, Rfl: 0 .  budesonide-formoterol (SYMBICORT) 160-4.5 MCG/ACT inhaler, Inhale 2 puffs into the lungs 2 (two) times daily., Disp: , Rfl:  .  fluticasone (FLONASE) 50 MCG/ACT nasal spray, Place  2 sprays into both nostrils daily as needed for allergies. , Disp: , Rfl:  .  furosemide (LASIX) 40 MG tablet, Take 40 mg by mouth 2 (two) times daily. , Disp: , Rfl:  .  Ipratropium-Albuterol (COMBIVENT RESPIMAT) 20-100 MCG/ACT AERS respimat, Inhale 1 puff into the lungs every 6 (six) hours as needed for wheezing or shortness of breath. , Disp: , Rfl:  .  ipratropium-albuterol (DUONEB) 0.5-2.5 (3) MG/3ML SOLN, Take 3 mLs by nebulization every 4 (four) hours as needed (For wheezing or shortness of breath.)., Disp: , Rfl:  .  metoprolol tartrate (LOPRESSOR) 25 MG tablet, Take 25 mg by mouth daily as needed (For hypertension.). , Disp: , Rfl:  .  pantoprazole  (PROTONIX) 40 MG tablet, Take 40 mg by mouth daily as needed (For heartburn or acid reflux.). , Disp: , Rfl:  .  polyvinyl alcohol (LIQUIFILM TEARS) 1.4 % ophthalmic solution, Place 2 drops into both eyes as needed for dry eyes., Disp: , Rfl:  .  tadalafil, PAH, (ADCIRCA) 20 MG tablet, Take 20 mg by mouth daily., Disp: , Rfl:  .  tiotropium (SPIRIVA) 18 MCG inhalation capsule, Place 18 mcg into inhaler and inhale daily., Disp: , Rfl:  .  traMADol (ULTRAM) 50 MG tablet, Take 50 mg by mouth every 6 (six) hours as needed (For back spasms.)., Disp: , Rfl: :  :  Allergies  Allergen Reactions  . Aspirin Other (See Comments)    bleeding  . Latex Itching  . Morphine And Related Nausea And Vomiting  . Other Other (See Comments)    All controlled substances - for fear of becoming addicted  :  Family History  Problem Relation Age of Onset  . Colon cancer Father   :  Social History   Social History  . Marital status: Legally Separated    Spouse name: N/A  . Number of children: N/A  . Years of education: N/A   Occupational History  . Not on file.   Social History Main Topics  . Smoking status: Never Smoker  . Smokeless tobacco: Never Used  . Alcohol use No  . Drug use: No  . Sexual activity: Not on file   Other Topics Concern  . Not on file   Social History Narrative  . No narrative on file  :  Pertinent items are noted in HPI.  Exam: @IPVITALS @  Well-developed and well-nourished African-American female in no obvious distress. Her vital signs show temperature of 98. Pulse 62. Blood pressure 124/61. Weight is 165 pounds. Head and neck exam shows no ocular or oral lesion. There are no palpable cervical or supraclavicular lymph nodes. She does have some telangiectasias on her lips. Thyroid is nonpalpable. Lungs are with some scattered crackles bilaterally. She has good air movement bilaterally. Cardiac exam regular rate and rhythm with no murmurs, rubs or bruits. Abdomen is soft.  She has some slight tenderness in the left lower quadrant. No fluid wave is noted. She has no obvious hepatomegaly. There is no splenomegaly. Extremities shows 1+ edema bilaterally. Back exam shows no tenderness over the spine, ribs or hips. She has some slight kyphosis. Skin exam shows some scattered cutaneous telangiectasias.    Recent Labs  01/25/16 1454  WBC 4.1  HGB 8.1*  HCT 28.9*  PLT 324    Recent Labs  01/25/16 1454  NA 141  K 3.8  CL 107  CO2 28  GLUCOSE 85  BUN 8  CREATININE 0.9  CALCIUM 10.6*  Blood smear review:  Hyperchromic and microcytic red blood cells. She has some anisocytosis and poikilocytosis. There is no nucleated red blood cells. She has no target cells. She has no rouleau formation. White cells. Normal in morphology and maturation. She has no hypersegmented polys. She has no immature myeloid lymphoid forms. Platelets are adequate in number and size.  Pathology: None     Assessment and Plan:  Ms. Laspisa is a very charming 70 year old African American female. I know her well. I have not seen her for 7 years.  We have a lot to catch up with. I'm not sure at all as to what was said her or what was done down in [place].  We have to, in my mind, get a CT scan to assess for her lungs and abdomen. With telangiectasias, she may have some pulmonary telangiectasias that could lead to hemoptysis. On her last, her alkaline phosphatase was up a little bit so I want to make sure that there is nothing going on with her liver.  We really have to get her hemoglobin up. I think this is why her legs are swollen. I think if we can ameliorate her anemia, but her legs should get better.  We will have to see what her response is to iron. I will give her some Feraheme. I don't think we need to transfuse her.  I do want an echocardiogram on her so that we can assess her cardiac function.  I spent about an hour with her. It was really nice to see her again area and I  want to do as much as we can as quickly as we can so that she will feel better for the holidays area and she has a lot of family members and I want her to be able to enjoy spending time with them.  I would like to see her back in one month.

## 2016-01-26 LAB — LACTATE DEHYDROGENASE: LDH: 223 U/L (ref 125–245)

## 2016-01-26 LAB — RETICULOCYTES: RETICULOCYTE COUNT: 0.7 % (ref 0.6–2.6)

## 2016-01-26 LAB — IRON AND TIBC
%SAT: 4 % — AB (ref 21–57)
Iron: 14 ug/dL — ABNORMAL LOW (ref 41–142)
TIBC: 331 ug/dL (ref 236–444)
UIBC: 317 ug/dL (ref 120–384)

## 2016-01-26 LAB — FERRITIN: Ferritin: 14 ng/ml (ref 9–269)

## 2016-01-26 MED ORDER — LIDOCAINE-PRILOCAINE 2.5-2.5 % EX CREA
TOPICAL_CREAM | CUTANEOUS | 6 refills | Status: AC
Start: 2016-01-26 — End: ?

## 2016-01-26 NOTE — Addendum Note (Signed)
Addended by: Burney Gauze R on: 01/26/2016 05:20 PM   Modules accepted: Orders

## 2016-01-29 ENCOUNTER — Ambulatory Visit: Payer: Medicare Other

## 2016-01-29 ENCOUNTER — Ambulatory Visit (HOSPITAL_BASED_OUTPATIENT_CLINIC_OR_DEPARTMENT_OTHER)
Admission: RE | Admit: 2016-01-29 | Discharge: 2016-01-29 | Disposition: A | Discharge status: Home / Self Care | Payer: Medicare Other | Source: Ambulatory Visit | Attending: Hematology & Oncology | Admitting: Hematology & Oncology

## 2016-01-29 ENCOUNTER — Ambulatory Visit (HOSPITAL_BASED_OUTPATIENT_CLINIC_OR_DEPARTMENT_OTHER): Payer: Medicare Other

## 2016-01-29 VITALS — BP 116/54 | HR 64 | Temp 97.5°F | Resp 16

## 2016-01-29 VITALS — BP 153/68 | HR 63 | Temp 97.7°F | Resp 18

## 2016-01-29 DIAGNOSIS — D500 Iron deficiency anemia secondary to blood loss (chronic): Secondary | ICD-10-CM

## 2016-01-29 DIAGNOSIS — I780 Hereditary hemorrhagic telangiectasia: Secondary | ICD-10-CM

## 2016-01-29 DIAGNOSIS — I313 Pericardial effusion (noninflammatory): Secondary | ICD-10-CM | POA: Insufficient documentation

## 2016-01-29 DIAGNOSIS — I517 Cardiomegaly: Secondary | ICD-10-CM | POA: Insufficient documentation

## 2016-01-29 DIAGNOSIS — I700 Atherosclerosis of aorta: Secondary | ICD-10-CM | POA: Diagnosis not present

## 2016-01-29 DIAGNOSIS — D509 Iron deficiency anemia, unspecified: Secondary | ICD-10-CM | POA: Diagnosis not present

## 2016-01-29 DIAGNOSIS — Q2572 Congenital pulmonary arteriovenous malformation: Secondary | ICD-10-CM | POA: Diagnosis not present

## 2016-01-29 DIAGNOSIS — R918 Other nonspecific abnormal finding of lung field: Secondary | ICD-10-CM | POA: Diagnosis not present

## 2016-01-29 DIAGNOSIS — R590 Localized enlarged lymph nodes: Secondary | ICD-10-CM | POA: Insufficient documentation

## 2016-01-29 DIAGNOSIS — K5730 Diverticulosis of large intestine without perforation or abscess without bleeding: Secondary | ICD-10-CM | POA: Insufficient documentation

## 2016-01-29 DIAGNOSIS — D3502 Benign neoplasm of left adrenal gland: Secondary | ICD-10-CM | POA: Diagnosis not present

## 2016-01-29 MED ORDER — METHYLPREDNISOLONE SODIUM SUCC 125 MG IJ SOLR
125.0000 mg | Freq: Once | INTRAMUSCULAR | Status: AC
  Administered 2016-01-29: 125 mg via INTRAVENOUS

## 2016-01-29 MED ORDER — ACETAMINOPHEN 500 MG PO TABS
ORAL_TABLET | ORAL | Status: AC
  Filled 2016-01-29: qty 1

## 2016-01-29 MED ORDER — ACETAMINOPHEN 500 MG PO TABS
500.0000 mg | ORAL_TABLET | Freq: Once | ORAL | Status: AC
  Administered 2016-01-29: 500 mg via ORAL

## 2016-01-29 MED ORDER — METHYLPREDNISOLONE SODIUM SUCC 125 MG IJ SOLR
INTRAMUSCULAR | Status: AC
  Filled 2016-01-29: qty 2

## 2016-01-29 MED ORDER — SODIUM CHLORIDE 0.9 % IV SOLN
510.0000 mg | Freq: Once | INTRAVENOUS | Status: AC
  Administered 2016-01-29: 510 mg via INTRAVENOUS
  Filled 2016-01-29: qty 17

## 2016-01-29 MED ORDER — SODIUM CHLORIDE 0.9 % IV SOLN
Freq: Once | INTRAVENOUS | Status: AC
  Administered 2016-01-29: 11:00:00 via INTRAVENOUS

## 2016-01-29 MED ORDER — IOPAMIDOL (ISOVUE-300) INJECTION 61%
100.0000 mL | Freq: Once | INTRAVENOUS | Status: AC | PRN
  Administered 2016-01-29: 100 mL via INTRAVENOUS

## 2016-01-29 NOTE — Patient Instructions (Signed)
Ferumoxytol injection What is this medicine? FERUMOXYTOL is an iron complex. Iron is used to make healthy red blood cells, which carry oxygen and nutrients throughout the body. This medicine is used to treat iron deficiency anemia in people with chronic kidney disease. COMMON BRAND NAME(S): Feraheme What should I tell my health care provider before I take this medicine? They need to know if you have any of these conditions: -anemia not caused by low iron levels -high levels of iron in the blood -magnetic resonance imaging (MRI) test scheduled -an unusual or allergic reaction to iron, other medicines, foods, dyes, or preservatives -pregnant or trying to get pregnant -breast-feeding How should I use this medicine? This medicine is for injection into a vein. It is given by a health care professional in a hospital or clinic setting. Talk to your pediatrician regarding the use of this medicine in children. Special care may be needed. What if I miss a dose? It is important not to miss your dose. Call your doctor or health care professional if you are unable to keep an appointment. What may interact with this medicine? This medicine may interact with the following medications: -other iron products What should I watch for while using this medicine? Visit your doctor or healthcare professional regularly. Tell your doctor or healthcare professional if your symptoms do not start to get better or if they get worse. You may need blood work done while you are taking this medicine. You may need to follow a special diet. Talk to your doctor. Foods that contain iron include: whole grains/cereals, dried fruits, beans, or peas, leafy green vegetables, and organ meats (liver, kidney). What side effects may I notice from receiving this medicine? Side effects that you should report to your doctor or health care professional as soon as possible: -allergic reactions like skin rash, itching or hives, swelling of the  face, lips, or tongue -breathing problems -changes in blood pressure -feeling faint or lightheaded, falls -fever or chills -flushing, sweating, or hot feelings -swelling of the ankles or feet Side effects that usually do not require medical attention (report to your doctor or health care professional if they continue or are bothersome): -diarrhea -headache -nausea, vomiting -stomach pain Where should I keep my medicine? This drug is given in a hospital or clinic and will not be stored at home.  2017 Elsevier/Gold Standard (2015-03-30 12:41:49)  

## 2016-01-29 NOTE — Patient Instructions (Signed)
CT Scan A CT scan is a kind of X-ray that makes pictures of parts of your body. It is a painless way for your doctor to see inside your body. The CT scanner is a large machine that has an opening. Your body moves through the opening so that pictures can be taken. BEFORE THE PROCEDURE  The day before the test, do not have drinks with caffeine. Drinks with caffeine include energy drinks, tea, soda, coffee, and hot chocolate.  On the day of the test:  Do not eat at least 4 hours before the test or as told by your doctor.  Do not drink anything but water at least 4 hours before the test or as told by your doctor.  Leave your jewelry at home. You will have to take off some or all of your clothing. Your doctor will give you a hospital gown to wear. PROCEDURE   You will lie on a table with your arms above your head.  An IV tube may be put in your arm. If this is done, liquid will be put through the tube. It may make you feel warm or give you a metal taste in your mouth.  The table you will be lying on will move into a large machine.  You will be able to see, hear, and talk to the person running the machine while you are in it. Follow that person's directions.  The machine will move around you to take pictures. Do not move.  When the machine is done taking pictures it will be turned off.  The table will be moved out of the machine.  If you had an IV tube put in, it will be removed. AFTER THE PROCEDURE Ask your doctor when to call or come in for your test results. This information is not intended to replace advice given to you by your health care provider. Make sure you discuss any questions you have with your health care provider. Document Released: 05/24/2008 Document Revised: 03/02/2013 Document Reviewed: 11/11/2012 Elsevier Interactive Patient Education  2017 [employer].

## 2016-02-05 ENCOUNTER — Ambulatory Visit: Payer: Medicare Other

## 2016-02-06 ENCOUNTER — Ambulatory Visit (HOSPITAL_BASED_OUTPATIENT_CLINIC_OR_DEPARTMENT_OTHER): Payer: Medicare Other

## 2016-02-06 ENCOUNTER — Other Ambulatory Visit: Payer: Self-pay | Admitting: Family

## 2016-02-06 VITALS — BP 136/63 | HR 78 | Temp 98.2°F | Resp 18

## 2016-02-06 DIAGNOSIS — D509 Iron deficiency anemia, unspecified: Secondary | ICD-10-CM

## 2016-02-06 DIAGNOSIS — I780 Hereditary hemorrhagic telangiectasia: Secondary | ICD-10-CM

## 2016-02-06 DIAGNOSIS — D500 Iron deficiency anemia secondary to blood loss (chronic): Secondary | ICD-10-CM

## 2016-02-06 MED ORDER — SODIUM CHLORIDE 0.9 % IV SOLN
Freq: Once | INTRAVENOUS | Status: AC
  Administered 2016-02-06: 09:00:00 via INTRAVENOUS

## 2016-02-06 MED ORDER — HEPARIN SOD (PORK) LOCK FLUSH 100 UNIT/ML IV SOLN
500.0000 [IU] | Freq: Once | INTRAVENOUS | Status: AC
  Administered 2016-02-06: 500 [IU] via INTRAVENOUS
  Filled 2016-02-06: qty 5

## 2016-02-06 MED ORDER — METHYLPREDNISOLONE SODIUM SUCC 125 MG IJ SOLR
INTRAMUSCULAR | Status: AC
  Filled 2016-02-06: qty 2

## 2016-02-06 MED ORDER — ACETAMINOPHEN 500 MG PO TABS
ORAL_TABLET | ORAL | Status: AC
  Filled 2016-02-06: qty 1

## 2016-02-06 MED ORDER — SODIUM CHLORIDE 0.9 % IV SOLN
510.0000 mg | Freq: Once | INTRAVENOUS | Status: AC
  Administered 2016-02-06: 510 mg via INTRAVENOUS
  Filled 2016-02-06: qty 17

## 2016-02-06 MED ORDER — ACETAMINOPHEN 500 MG PO TABS
500.0000 mg | ORAL_TABLET | Freq: Once | ORAL | Status: AC
  Administered 2016-02-06: 500 mg via ORAL

## 2016-02-06 MED ORDER — METHYLPREDNISOLONE SODIUM SUCC 125 MG IJ SOLR
125.0000 mg | Freq: Once | INTRAMUSCULAR | Status: AC
  Administered 2016-02-06: 125 mg via INTRAVENOUS

## 2016-02-06 MED ORDER — SODIUM CHLORIDE 0.9% FLUSH
10.0000 mL | INTRAVENOUS | Status: DC | PRN
Start: 2016-02-06 — End: 2016-02-06
  Administered 2016-02-06: 10 mL via INTRAVENOUS
  Filled 2016-02-06: qty 10

## 2016-02-06 NOTE — Patient Instructions (Signed)
Ferumoxytol injection What is this medicine? FERUMOXYTOL is an iron complex. Iron is used to make healthy red blood cells, which carry oxygen and nutrients throughout the body. This medicine is used to treat iron deficiency anemia in people with chronic kidney disease. COMMON BRAND NAME(S): Feraheme What should I tell my health care provider before I take this medicine? They need to know if you have any of these conditions: -anemia not caused by low iron levels -high levels of iron in the blood -magnetic resonance imaging (MRI) test scheduled -an unusual or allergic reaction to iron, other medicines, foods, dyes, or preservatives -pregnant or trying to get pregnant -breast-feeding How should I use this medicine? This medicine is for injection into a vein. It is given by a health care professional in a hospital or clinic setting. Talk to your pediatrician regarding the use of this medicine in children. Special care may be needed. What if I miss a dose? It is important not to miss your dose. Call your doctor or health care professional if you are unable to keep an appointment. What may interact with this medicine? This medicine may interact with the following medications: -other iron products What should I watch for while using this medicine? Visit your doctor or healthcare professional regularly. Tell your doctor or healthcare professional if your symptoms do not start to get better or if they get worse. You may need blood work done while you are taking this medicine. You may need to follow a special diet. Talk to your doctor. Foods that contain iron include: whole grains/cereals, dried fruits, beans, or peas, leafy green vegetables, and organ meats (liver, kidney). What side effects may I notice from receiving this medicine? Side effects that you should report to your doctor or health care professional as soon as possible: -allergic reactions like skin rash, itching or hives, swelling of the  face, lips, or tongue -breathing problems -changes in blood pressure -feeling faint or lightheaded, falls -fever or chills -flushing, sweating, or hot feelings -swelling of the ankles or feet Side effects that usually do not require medical attention (report to your doctor or health care professional if they continue or are bothersome): -diarrhea -headache -nausea, vomiting -stomach pain Where should I keep my medicine? This drug is given in a hospital or clinic and will not be stored at home.  2017 Elsevier/Gold Standard (2015-03-30 12:41:49)  

## 2016-02-07 ENCOUNTER — Ambulatory Visit (HOSPITAL_BASED_OUTPATIENT_CLINIC_OR_DEPARTMENT_OTHER): Payer: Medicare Other

## 2016-02-08 ENCOUNTER — Ambulatory Visit (HOSPITAL_BASED_OUTPATIENT_CLINIC_OR_DEPARTMENT_OTHER)
Admission: RE | Admit: 2016-02-08 | Discharge: 2016-02-08 | Disposition: A | Discharge status: Home / Self Care | Payer: Medicare Other | Source: Ambulatory Visit | Attending: Hematology & Oncology | Admitting: Hematology & Oncology

## 2016-02-08 DIAGNOSIS — I340 Nonrheumatic mitral (valve) insufficiency: Secondary | ICD-10-CM | POA: Diagnosis not present

## 2016-02-08 DIAGNOSIS — I471 Supraventricular tachycardia: Secondary | ICD-10-CM | POA: Diagnosis not present

## 2016-02-08 DIAGNOSIS — I371 Nonrheumatic pulmonary valve insufficiency: Secondary | ICD-10-CM | POA: Insufficient documentation

## 2016-02-08 DIAGNOSIS — D500 Iron deficiency anemia secondary to blood loss (chronic): Secondary | ICD-10-CM | POA: Diagnosis not present

## 2016-02-08 DIAGNOSIS — I429 Cardiomyopathy, unspecified: Secondary | ICD-10-CM | POA: Insufficient documentation

## 2016-02-08 DIAGNOSIS — I509 Heart failure, unspecified: Secondary | ICD-10-CM | POA: Diagnosis not present

## 2016-02-08 DIAGNOSIS — I071 Rheumatic tricuspid insufficiency: Secondary | ICD-10-CM | POA: Insufficient documentation

## 2016-02-08 DIAGNOSIS — I780 Hereditary hemorrhagic telangiectasia: Secondary | ICD-10-CM | POA: Diagnosis present

## 2016-02-08 NOTE — Progress Notes (Signed)
  Echocardiogram 2D Echocardiogram has been performed.  Bobbye Charleston 02/08/2016, 12:07 PM

## 2016-02-09 ENCOUNTER — Telehealth: Payer: Self-pay | Admitting: *Deleted

## 2016-02-09 NOTE — Telephone Encounter (Addendum)
Patient is aware of results  ----- Message from Volanda Napoleon, MD sent at 02/09/2016  7:02 AM EST ----- Call - your heart is pumping well!!!  pete

## 2016-02-29 ENCOUNTER — Ambulatory Visit (HOSPITAL_BASED_OUTPATIENT_CLINIC_OR_DEPARTMENT_OTHER): Payer: Medicare Other | Admitting: Hematology & Oncology

## 2016-02-29 ENCOUNTER — Other Ambulatory Visit (HOSPITAL_BASED_OUTPATIENT_CLINIC_OR_DEPARTMENT_OTHER): Payer: Medicare Other

## 2016-02-29 ENCOUNTER — Ambulatory Visit: Payer: Medicare Other

## 2016-02-29 VITALS — BP 144/46 | HR 68 | Resp 16 | Wt 160.0 lb

## 2016-02-29 DIAGNOSIS — I780 Hereditary hemorrhagic telangiectasia: Secondary | ICD-10-CM | POA: Diagnosis not present

## 2016-02-29 DIAGNOSIS — D500 Iron deficiency anemia secondary to blood loss (chronic): Secondary | ICD-10-CM

## 2016-02-29 LAB — CBC WITH DIFFERENTIAL (CANCER CENTER ONLY)
BASO#: 0 10*3/uL (ref 0.0–0.2)
BASO%: 1.2 % (ref 0.0–2.0)
EOS%: 3.7 % (ref 0.0–7.0)
Eosinophils Absolute: 0.1 10*3/uL (ref 0.0–0.5)
HEMATOCRIT: 34 % — AB (ref 34.8–46.6)
HEMOGLOBIN: 10.1 g/dL — AB (ref 11.6–15.9)
LYMPH#: 0.9 10*3/uL (ref 0.9–3.3)
LYMPH%: 27.1 % (ref 14.0–48.0)
MCH: 24 pg — ABNORMAL LOW (ref 26.0–34.0)
MCHC: 29.7 g/dL — ABNORMAL LOW (ref 32.0–36.0)
MCV: 81 fL (ref 81–101)
MONO#: 0.3 10*3/uL (ref 0.1–0.9)
MONO%: 9.8 % (ref 0.0–13.0)
NEUT#: 1.9 10*3/uL (ref 1.5–6.5)
NEUT%: 58.2 % (ref 39.6–80.0)
Platelets: 238 10*3/uL (ref 145–400)
RBC: 4.21 10*6/uL (ref 3.70–5.32)
RDW: 27.4 % — ABNORMAL HIGH (ref 11.1–15.7)
WBC: 3.3 10*3/uL — AB (ref 3.9–10.0)

## 2016-02-29 NOTE — Progress Notes (Signed)
Hematology and Oncology Follow Up Visit  Kimbelry Odens [id number] Jun 05, 1945 70 y.o. 02/29/2016   Principle Diagnosis:   HHT with intermittent GI/pulmonary bleeding  Iron deficiency anemia  Current Therapy:    IV Iron with feraheme     Interim History:  Ms. Hogancamp is back for follow-up. We first saw her about 6 weeks ago. We actually had seen her prior to this back in September 2010.  When she came back to see [place], I did another evaluation on her. I did a body CT scan. 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 She had a couple nodules in the right lower lobe. It was felt that these needed follow-up in about 6 months. Should moderate cardiomegaly. She had stable right paratracheal adenopathy which was mild. 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   We did do an echocardiogram on her. This showed an ejection fraction of 55-60%. She had a severely dilated left atrium. She had a severely dilated right atrium. She had moderate pulmonic valve regurgitation. She had a thickened aortic valve.  She probably needs to see a cardiologist up here. She also needs to see a pulmonologist.  She has had no obvious bleeding. She has occasional nosebleeds.  She's had no fever. She's had no rashes. She's had no leg swelling.  She is on quite a few medications. She really wants to get off some of these medicines.  Overall, her performance status is ECOG 1.   Medications:  Current Outpatient Prescriptions:  .  amiodarone (PACERONE) 200 MG tablet, Take 200 mg by mouth daily as needed (For high blood pressure.). , Disp: , Rfl:  .  azithromycin (ZITHROMAX Z-PAK) 250 MG tablet, 2 po day one, then 1 daily x 4 days, Disp: 6 tablet, Rfl: 0 .  budesonide-formoterol (SYMBICORT) 160-4.5 MCG/ACT inhaler, Inhale 2 puffs into the lungs 2 (two) times daily., Disp: , Rfl:  .  fluticasone (FLONASE) 50 MCG/ACT nasal spray, Place 2 sprays into both nostrils daily as needed for allergies. , Disp: ,  Rfl:  .  furosemide (LASIX) 40 MG tablet, Take 40 mg by mouth 2 (two) times daily. , Disp: , Rfl:  .  Ipratropium-Albuterol (COMBIVENT RESPIMAT) 20-100 MCG/ACT AERS respimat, Inhale 1 puff into the lungs every 6 (six) hours as needed for wheezing or shortness of breath. , Disp: , Rfl:  .  ipratropium-albuterol (DUONEB) 0.5-2.5 (3) MG/3ML SOLN, Take 3 mLs by nebulization every 4 (four) hours as needed (For wheezing or shortness of breath.)., Disp: , Rfl:  .  lidocaine-prilocaine (EMLA) cream, Apply to port site 1 hour PRIOR to coming to the office!!, Disp: 30 g, Rfl: 6 .  metoprolol tartrate (LOPRESSOR) 25 MG tablet, Take 25 mg by mouth daily as needed (For hypertension.). , Disp: , Rfl:  .  pantoprazole (PROTONIX) 40 MG tablet, Take 40 mg by mouth daily as needed (For heartburn or acid reflux.). , Disp: , Rfl:  .  polyvinyl alcohol (LIQUIFILM TEARS) 1.4 % ophthalmic solution, Place 2 drops into both eyes as needed for dry eyes., Disp: , Rfl:  .  tadalafil, PAH, (ADCIRCA) 20 MG tablet, Take 20 mg by mouth daily., Disp: , Rfl:  .  tiotropium (SPIRIVA) 18 MCG inhalation capsule, Place 18 mcg into inhaler and inhale daily., Disp: , Rfl:  .  traMADol (ULTRAM) 50 MG tablet, Take 50 mg by mouth every 6 (six) hours as needed (For back spasms.)., Disp: , Rfl:   Allergies:  Allergies  Allergen  Reactions  . Aspirin Other (See Comments)    bleeding bleeding  . Other Other (See Comments)    All controlled substances - for fear of becoming addicted  . Latex Itching  . Morphine And Related Nausea And Vomiting    Past Medical History, Surgical history, Social history, and Family History were reviewed and updated.  Review of Systems:  As above  Physical Exam:  weight is 160 lb (72.6 kg). Her blood pressure is 144/46 (abnormal) and her pulse is 68. Her respiration is 16.   Wt Readings from Last 3 Encounters:  02/29/16 160 lb (72.6 kg)  01/25/16 165 lb (74.8 kg)  01/02/16 166 lb 3.6 oz (75.4 kg)      Head and neck exam shows no ocular or oral lesion. There are no palpable cervical or supraclavicular lymph nodes. She does have some telangiectasias on her lips. Thyroid is nonpalpable. Lungs are with some scattered crackles bilaterally. She has good air movement bilaterally. Cardiac exam regular rate and rhythm with no murmurs, rubs or bruits. Abdomen is soft. She has some slight tenderness in the left lower quadrant. No fluid wave is noted. She has no obvious hepatomegaly. There is no splenomegaly. Extremities shows 1+ edema bilaterally. Back exam shows no tenderness over the spine, ribs or hips. She has some slight kyphosis. Skin exam shows some scattered cutaneous telangiectasias.   Lab Results  Component Value Date   WBC 3.3 (L) 02/29/2016   HGB 10.1 (L) 02/29/2016   HCT 34.0 (L) 02/29/2016   MCV 81 02/29/2016   PLT 238 02/29/2016     Chemistry      Component Value Date/Time   NA 141 01/25/2016 1454   K 3.8 01/25/2016 1454   CL 107 01/25/2016 1454   CO2 28 01/25/2016 1454   BUN 8 01/25/2016 1454   CREATININE 0.9 01/25/2016 1454      Component Value Date/Time   CALCIUM 10.6 (H) 01/25/2016 1454   ALKPHOS 160 (H) 01/25/2016 1454   AST 25 01/25/2016 1454   ALT 24 01/25/2016 1454   BILITOT 0.60 01/25/2016 1454         Impression and Plan: Ms. Dacres is A 70 year old African American female. She has HHT.  We give her iron. She probably will need another dose of iron.  We saw her months ago, her ferritin was only 14. Her iron saturation was 4%.  Because of the HHT, she has intermittent bleeding. She will need intermittent IV iron. Oral iron does not work for her.  We had her set up with iron today. However, she cannot do this because of family obligations. She'll come back after Christmas.  We will refer her to Dr. Lamonte Sakai of pulmonary medicine. I will also refer to Dr. Acie Fredrickson of cardiology.   I will see her back in 6 weeks.  I spent about 25 minutes with her  today.   Volanda Napoleon, MD 12/21/201712:57 PM

## 2016-03-01 ENCOUNTER — Telehealth: Payer: Self-pay

## 2016-03-01 LAB — IRON AND TIBC
%SAT: 12 % — ABNORMAL LOW (ref 21–57)
Iron: 39 ug/dL — ABNORMAL LOW (ref 41–142)
TIBC: 310 ug/dL (ref 236–444)
UIBC: 271 ug/dL (ref 120–384)

## 2016-03-01 LAB — FERRITIN: FERRITIN: 30 ng/mL (ref 9–269)

## 2016-03-01 LAB — RETICULOCYTES: RETICULOCYTE COUNT: 1.2 % (ref 0.6–2.6)

## 2016-03-01 LAB — ERYTHROPOIETIN: ERYTHROPOIETIN: 130.4 m[IU]/mL — AB (ref 2.6–18.5)

## 2016-03-01 NOTE — Telephone Encounter (Addendum)
-----   Message from Volanda Napoleon, MD sent at 03/01/2016 10:23 AM EST ----- Call - iron is still low but better!!  Needs 2 more doses of feraheme!!  [name]  Attempted to contact pt multiple times with this message. Voice mailbox has not been set up at this time. Will continue to attempt after returning from Christmas break. dph

## 2016-03-05 ENCOUNTER — Other Ambulatory Visit: Payer: Self-pay | Admitting: Family

## 2016-03-05 ENCOUNTER — Ambulatory Visit (HOSPITAL_BASED_OUTPATIENT_CLINIC_OR_DEPARTMENT_OTHER): Payer: Medicare Other

## 2016-03-05 ENCOUNTER — Telehealth: Payer: Self-pay | Admitting: *Deleted

## 2016-03-05 VITALS — BP 112/63 | HR 67 | Temp 97.5°F | Resp 18

## 2016-03-05 DIAGNOSIS — D500 Iron deficiency anemia secondary to blood loss (chronic): Secondary | ICD-10-CM

## 2016-03-05 DIAGNOSIS — I780 Hereditary hemorrhagic telangiectasia: Secondary | ICD-10-CM | POA: Diagnosis not present

## 2016-03-05 MED ORDER — METHYLPREDNISOLONE SODIUM SUCC 125 MG IJ SOLR
125.0000 mg | Freq: Once | INTRAMUSCULAR | Status: AC
  Administered 2016-03-05: 125 mg via INTRAVENOUS

## 2016-03-05 MED ORDER — SODIUM CHLORIDE 0.9 % IV SOLN
Freq: Once | INTRAVENOUS | Status: AC
  Administered 2016-03-05: 15:00:00 via INTRAVENOUS

## 2016-03-05 MED ORDER — HEPARIN SOD (PORK) LOCK FLUSH 100 UNIT/ML IV SOLN
500.0000 [IU] | Freq: Once | INTRAVENOUS | Status: AC
  Administered 2016-03-05: 500 [IU] via INTRAVENOUS
  Filled 2016-03-05: qty 5

## 2016-03-05 MED ORDER — METHYLPREDNISOLONE SODIUM SUCC 125 MG IJ SOLR
INTRAMUSCULAR | Status: AC
  Filled 2016-03-05: qty 2

## 2016-03-05 MED ORDER — ACETAMINOPHEN 500 MG PO TABS
ORAL_TABLET | ORAL | Status: AC
  Filled 2016-03-05: qty 1

## 2016-03-05 MED ORDER — ACETAMINOPHEN 500 MG PO TABS
500.0000 mg | ORAL_TABLET | Freq: Once | ORAL | Status: AC
  Administered 2016-03-05: 500 mg via ORAL

## 2016-03-05 MED ORDER — SODIUM CHLORIDE 0.9 % IV SOLN
510.0000 mg | Freq: Once | INTRAVENOUS | Status: AC
  Administered 2016-03-05: 510 mg via INTRAVENOUS
  Filled 2016-03-05: qty 17

## 2016-03-05 MED ORDER — SODIUM CHLORIDE 0.9% FLUSH
10.0000 mL | INTRAVENOUS | Status: DC | PRN
  Administered 2016-03-05: 10 mL via INTRAVENOUS
  Filled 2016-03-05: qty 10

## 2016-03-05 NOTE — Progress Notes (Signed)
Patient refused to stay for 30 min post iron infusion.

## 2016-03-05 NOTE — Telephone Encounter (Addendum)
Spoke to patient. She is coming in today for dose #1, will schedule 2nd dose when here.  ----- Message from Volanda Napoleon, MD sent at 03/01/2016 10:23 AM EST ----- Call - iron is still low but better!!  Needs 2 more doses of feraheme!!  pete

## 2016-03-05 NOTE — Patient Instructions (Signed)
Ferumoxytol injection What is this medicine? FERUMOXYTOL is an iron complex. Iron is used to make healthy red blood cells, which carry oxygen and nutrients throughout the body. This medicine is used to treat iron deficiency anemia in people with chronic kidney disease. COMMON BRAND NAME(S): Feraheme What should I tell my health care provider before I take this medicine? They need to know if you have any of these conditions: -anemia not caused by low iron levels -high levels of iron in the blood -magnetic resonance imaging (MRI) test scheduled -an unusual or allergic reaction to iron, other medicines, foods, dyes, or preservatives -pregnant or trying to get pregnant -breast-feeding How should I use this medicine? This medicine is for injection into a vein. It is given by a health care professional in a hospital or clinic setting. Talk to your pediatrician regarding the use of this medicine in children. Special care may be needed. What if I miss a dose? It is important not to miss your dose. Call your doctor or health care professional if you are unable to keep an appointment. What may interact with this medicine? This medicine may interact with the following medications: -other iron products What should I watch for while using this medicine? Visit your doctor or healthcare professional regularly. Tell your doctor or healthcare professional if your symptoms do not start to get better or if they get worse. You may need blood work done while you are taking this medicine. You may need to follow a special diet. Talk to your doctor. Foods that contain iron include: whole grains/cereals, dried fruits, beans, or peas, leafy green vegetables, and organ meats (liver, kidney). What side effects may I notice from receiving this medicine? Side effects that you should report to your doctor or health care professional as soon as possible: -allergic reactions like skin rash, itching or hives, swelling of the  face, lips, or tongue -breathing problems -changes in blood pressure -feeling faint or lightheaded, falls -fever or chills -flushing, sweating, or hot feelings -swelling of the ankles or feet Side effects that usually do not require medical attention (report to your doctor or health care professional if they continue or are bothersome): -diarrhea -headache -nausea, vomiting -stomach pain Where should I keep my medicine? This drug is given in a hospital or clinic and will not be stored at home.  2017 Elsevier/Gold Standard (2015-03-30 12:41:49)  

## 2016-03-07 ENCOUNTER — Telehealth: Payer: Self-pay | Admitting: Nurse Practitioner

## 2016-03-07 NOTE — Telephone Encounter (Signed)
Attempted to call patient to schedule appointment per note from Dr. Acie Fredrickson.  He was asked by Dr. Marin Olp to evaluate patient who is new to the area.  I attempted to call patient on 2 different occasions; her voice mail has not been set up. I spoke with emergency contact who verified patient's cell number. I told her the nature of my call and she states she will relay message to patient to call our office. I thanked her for her help.

## 2016-04-03 ENCOUNTER — Telehealth: Payer: Self-pay | Admitting: Emergency Medicine

## 2016-04-03 NOTE — Telephone Encounter (Signed)
Spoke with pt. She has been scheduled to see RB on 04/29/2016 at 3:45pm. Pt requested that I mail her a copy of the paperwork she will need to fill out. This has been placed in the outgoing mail.

## 2016-04-03 NOTE — Telephone Encounter (Signed)
-----   Message from Volanda Napoleon, MD sent at 02/29/2016  5:06 PM EST -----  Rob:  Can you please see Ms. Floris-Duncan in consultation as an outpatient??  She has HHT and has some pulmonary AVMs. She has occasional bouts of hemoptysis.  She is very nice.  She just moved up here from [place].  I hope that you will have a great and blessed Christmas Day!!  Laurey Arrow

## 2016-04-03 NOTE — Telephone Encounter (Signed)
lmtcb x1 for pt to make appointment.

## 2016-04-03 NOTE — Telephone Encounter (Signed)
Need to set this patient up for an OV / consult if not already done. Thanks.

## 2016-04-11 ENCOUNTER — Ambulatory Visit: Payer: Medicare Other | Admitting: Hematology & Oncology

## 2016-04-11 ENCOUNTER — Ambulatory Visit: Payer: Medicare Other

## 2016-04-11 ENCOUNTER — Other Ambulatory Visit: Payer: Medicare Other

## 2016-04-29 ENCOUNTER — Encounter: Payer: Self-pay | Admitting: Emergency Medicine

## 2016-04-29 ENCOUNTER — Other Ambulatory Visit: Payer: Self-pay

## 2016-04-29 ENCOUNTER — Ambulatory Visit (INDEPENDENT_AMBULATORY_CARE_PROVIDER_SITE_OTHER): Payer: Medicare Other | Admitting: Emergency Medicine

## 2016-04-29 DIAGNOSIS — I2721 Secondary pulmonary arterial hypertension: Secondary | ICD-10-CM | POA: Diagnosis not present

## 2016-04-29 DIAGNOSIS — I780 Hereditary hemorrhagic telangiectasia: Secondary | ICD-10-CM

## 2016-04-29 DIAGNOSIS — D500 Iron deficiency anemia secondary to blood loss (chronic): Secondary | ICD-10-CM

## 2016-04-29 DIAGNOSIS — J449 Chronic obstructive pulmonary disease, unspecified: Secondary | ICD-10-CM

## 2016-04-29 NOTE — Assessment & Plan Note (Signed)
History of pulmonary AVM and embolization treatment in the past. Her most recent was 2 years ago in [place]. Chest on 01/29/16 shows a known left lower lobe telangiectasia as well as an adjacent area of contrast that could represent extension. We need the old CT scans from [place] to compare. She will ultimately need serial CTs to keep surveillance. Depending on growth she may need further embolizations in the future.

## 2016-04-29 NOTE — Progress Notes (Signed)
Subjective:    Patient ID: [name], female    DOB: [birth year], 71 y.o.   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 She has been managed on adcirca before for the [facility], but she only took for 6 months, stopped it because she did not feel it helped her. She has had several pulmonary embolizations, most recent was 2 years ago in [place].   She has been on amiodarone, stopped in 12/2015.   She is also on BD's > spiriva and symbicort, combivent prn.    Review of Systems  Constitutional: Negative for fever and unexpected weight change.  HENT: Positive for dental problem. Negative for congestion, ear pain, nosebleeds, postnasal drip, rhinorrhea, sinus pressure, sneezing, sore throat and trouble swallowing.   Eyes: Negative for redness and itching.  Respiratory: Positive for shortness of breath. Negative for cough, chest tightness and wheezing.   Cardiovascular: Positive for palpitations. Negative for leg swelling.  Gastrointestinal: Negative for nausea and vomiting.  Genitourinary: Negative for dysuria.  Musculoskeletal: Negative for joint swelling.  Skin: Negative for rash.  Neurological: Negative for headaches.  Hematological: Does not bruise/bleed easily.  Psychiatric/Behavioral: Negative for dysphoric mood. The patient is not nervous/anxious.     Past Medical History:  Diagnosis Date  . Allergic rhinitis   . Anemia   . Breast cancer ([facility])   . Chronic bronchiolitis ([place])   . Chronic bronchiolitis ([place])   . CVA (cerebral vascular accident) ([place])   . Degenerative lumbar disc   . GERD (gastroesophageal reflux disease)   . Hemorrhage   . OAB (overactive bladder)   . 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 Peroneal neuropathy   . Pulmonary HTN     . Retinal detachment   . Sleep apnea   . SVT (supraventricular tachycardia) ([place])   . Systolic heart failure ([facility])      Family History  Problem Relation Age of Onset  . Colon cancer Father      Social History   Social History  . Marital status: Legally Separated    Spouse name: N/A  . Number of children: N/A  . Years of education: N/A   Occupational History  . Not on file.   Social History Main Topics  . Smoking status: Never Smoker  . Smokeless tobacco: Never Used  . Alcohol use No  . Drug use: No  . Sexual activity: Not on file   Other Topics Concern  . Not on file   Social History Narrative  . No narrative on file  has lived in [place], [place], [place],   Allergies  Allergen Reactions  . Aspirin Other (See Comments)    bleeding bleeding  . Other Other (See Comments)    All controlled substances - for fear of becoming addicted  . Latex Itching  . Morphine And Related Nausea And Vomiting     Outpatient Medications Prior to Visit  Medication Sig Dispense Refill  . budesonide-formoterol (SYMBICORT) 160-4.5 MCG/ACT inhaler Inhale 2 puffs into the lungs 2 (two) times daily.    . fluticasone (FLONASE) 50 MCG/ACT nasal spray Place 2 sprays into both nostrils daily as needed for allergies.     . Ipratropium-Albuterol (COMBIVENT RESPIMAT) 20-100 MCG/ACT AERS respimat Inhale 1 puff into the lungs every 6 (six) hours as needed for  wheezing or shortness of breath.     Marland Kitchen ipratropium-albuterol (DUONEB) 0.5-2.5 (3) MG/3ML SOLN Take 3 mLs by nebulization every 4 (four) hours as needed (For wheezing or shortness of breath.).    Marland Kitchen lidocaine-prilocaine (EMLA) cream Apply to port site 1 hour PRIOR to coming to the office!! 30 g 6  . metoprolol tartrate (LOPRESSOR) 25 MG tablet Take 25 mg by mouth daily as needed (For hypertension.).     Marland Kitchen pantoprazole (PROTONIX) 40 MG tablet Take 40 mg by mouth daily as needed (For heartburn or acid reflux.).     Marland Kitchen polyvinyl alcohol (LIQUIFILM TEARS) 1.4 %  ophthalmic solution Place 2 drops into both eyes as needed for dry eyes.    Marland Kitchen tiotropium (SPIRIVA) 18 MCG inhalation capsule Place 18 mcg into inhaler and inhale daily.    . traMADol (ULTRAM) 50 MG tablet Take 50 mg by mouth every 6 (six) hours as needed (For back spasms.).    Marland Kitchen amiodarone (PACERONE) 200 MG tablet Take 200 mg by mouth daily as needed (For high blood pressure.).     Marland Kitchen azithromycin (ZITHROMAX Z-PAK) 250 MG tablet 2 po day one, then 1 daily x 4 days (Patient not taking: Reported on 04/29/2016) 6 tablet 0  . furosemide (LASIX) 40 MG tablet Take 40 mg by mouth 2 (two) times daily.     . tadalafil, PAH, (ADCIRCA) 20 MG tablet Take 20 mg by mouth daily.     No facility-administered medications prior to visit.          Objective:   Physical Exam Vitals:   04/29/16 1548  BP: 132/66  Pulse: 76  SpO2: 99%  Weight: 163 lb 12.8 oz (74.3 kg)  Height: 5\' 6"  (1.676 m)   Gen: Pleasant, well-nourished, in no distress,  normal affect  ENT: She has 3 punctate telangiectasias on surface of her tongue. She also has some areas of blushing on the under surface of her tongue. Finally she has some evidence of telangiectasias in both nares.  Neck: No JVD, no TMG, no carotid bruits  Lungs: No use of accessory muscles, clear without rales or rhonchi  Cardiovascular: RRR, heart sounds normal, no murmur or gallops, no peripheral edema  Musculoskeletal: No deformities, no cyanosis or clubbing  Neuro: alert, non focal  Skin: Warm, no lesions or rashes     CT chest 01/29/16 --  COMPARISON:  09/13/2008 abdominal sonogram. 06/02/2007 chest CT angiogram.  FINDINGS: CT CHEST FINDINGS  Cardiovascular: Moderate cardiomegaly. Trace pericardial effusion/thickening. Atherosclerotic nonaneurysmal thoracic aorta. Dilated main pulmonary artery (3.5 cm diameter), increased from 3.0 cm on 06/02/2007. No central pulmonary emboli.  Mediastinum/Nodes: No discrete thyroid nodules.  Unremarkable esophagus. No axillary adenopathy. Mildly enlarged 1.2 cm right paratracheal node (series 2/image 18), not appreciably changed since 06/02/2007. No additional pathologically enlarged mediastinal or hilar nodes.  Lungs/Pleura: No pneumothorax. No pleural effusion. Stable mild subpleural reticulation in the anterior right upper and right middle lobes, suggestive of mild radiation fibrosis. Stable irregular parenchymal band in the peripheral right lower lobe, consistent with postinfectious/postinflammatory scarring. There are two new subsolid 6 mm pulmonary nodules in the central right lower lobe (series 6/ images 94 and 99). New 7 mm solid left lower lobe pulmonary nodule (series 6/ image 71), which appears to communicate with the adjacent pulmonary artery branches and may represent a pulmonary AVM. Interval embolization coils are seen in a medial basilar left lower lobe pulmonary artery branch. Posterior left lower lobe 7 x 5 mm arteriovenous malformation (series  6/image 106), decreased from 11 x 9 mm on 06/02/2007. No additional potential pulmonary arteriovenous malformations. No acute consolidative airspace disease.  Musculoskeletal: No aggressive appearing focal osseous lesions. Mild thoracic spondylosis. Postsurgical changes are again noted from lumpectomy in the upper right breast.  CT ABDOMEN PELVIS FINDINGS  Hepatobiliary: There is relative hypertrophy of the left liver lobe with the suggestion of a finely irregular liver surface, which may indicate cirrhosis. No liver mass. There is marked hypertrophy of the tortuous common and proper hepatic artery and intrahepatic arterial branches, with accessory dilated left hepatic artery arising from the left gastric artery. There is a suggestion of multiple arterial venous fistulas between the dilated hepatic artery branches and hepatic veins, for example in the medial segment left liver lobe (series 2/ image 52).  These findings appear chronic and not definitely changed from 06/02/2007. Normal gallbladder with no radiopaque cholelithiasis. No biliary ductal dilatation.  Pancreas: Normal, with no mass or duct dilation.  Spleen: Normal size spleen. Stable granulomatous calcification in the spleen. No splenic mass.  Adrenals/Urinary Tract: Normal right adrenal. Stable 2.9 cm left adrenal adenoma. No hydronephrosis. Subcentimeter hypodense renal cortical lesions in both kidneys are too small to characterize and require no further follow-up. Normal bladder.  Stomach/Bowel: Grossly normal stomach. Normal caliber small bowel with no small bowel wall thickening. Normal appendix. Mild sigmoid diverticulosis, with no large bowel wall thickening or pericolonic fat stranding. Oral contrast reaches the sigmoid colon.  Vascular/Lymphatic: Atherosclerotic nonaneurysmal abdominal aorta. Patent portal, splenic, hepatic and renal veins. No pathologically enlarged lymph nodes in the abdomen or pelvis.  Reproductive: Status post hysterectomy, with no abnormal findings at the vaginal cuff. No adnexal mass.  Other: No pneumoperitoneum, ascites or focal fluid collection.  Musculoskeletal: No aggressive appearing focal osseous lesions. Mild lumbar spondylosis.  IMPRESSION: 1. New 7 mm solid left lower lobe nodule, which appears to communicate with the pulmonary arterial tree, suggesting a new pulmonary AVM. 2. Previously described posterior left lower lobe pulmonary AVM has decreased in size status post interval embolization. 3. Two 6 mm subsolid central right lower lobe pulmonary nodules are new. Non-contrast chest CT at 3-6 months is recommended. If nodules persist, subsequent management will be based upon the most suspicious nodule(s). This recommendation follows the consensus statement: Guidelines for Management of Incidental Pulmonary Nodules Detected on CT Images: From the Fleischner Society  2017; Radiology 2017; 284:228-243. 4. Moderate cardiomegaly. Trace pericardial effusion/thickening. Dilated main pulmonary artery, increased, suggesting pulmonary arterial hypertension. 5. Mild right paratracheal lymphadenopathy is stable since 2009, consistent with benign reactive adenopathy. 6. Morphologic changes in the liver suggestive of cirrhosis. 7. Chronic arteriovenous malformation between the prominently dilated tortuous hepatic arteries and hepatic veins. 8. Additional findings include aortic atherosclerosis, stable left adrenal adenoma and mild sigmoid diverticulosis.    02/08/16:  Study Conclusions - Left ventricle: The cavity size was normal. Wall thickness was   increased in a pattern of mild LVH. Systolic function was normal.   The estimated ejection fraction was in the range of 55% to 60%. - Aortic valve: AV is thickened, calciefied with mildly restricted   motion Peak and mean gradients through the valve are 19 and 10 mm   Hg respectively. - Mitral valve: There was mild regurgitation. - Left atrium: The atrium was severely dilated. - Right ventricle: The cavity size was mildly dilated. Systolic   function was mildly reduced. - Right atrium: The atrium was severely dilated. - Pulmonic valve: There was moderate regurgitation. - Pulmonary arteries: PA  peak pressure: 75 mm Hg (S). - Pericardium, extracardiac: A trivial pericardial effusion was   identified.      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 Her most recent was 2 years ago in [place]. Chest on 01/29/16 shows a known left lower lobe telangiectasia as well as an adjacent area of contrast that could represent extension. We need the old CT scans from [place] to compare. She will ultimately need serial CTs to keep surveillance. Depending on growth she may need further embolizations in the future.   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 She has been on adcirca before but she stopped. We will need to follow TTE, PASP.   Obstructive lung disease ([place]) Zoomed obstructive lung disease given her response to bronchodilators. She is a never smoker, consider fixed asthma. She needs repeat pulmonary function testing to better evaluate. For now we will continue her Spiriva and Symbicort   Baltazar Apo, MD, PhD 04/29/2016, 4:21 PM [facility] [phone] or if no answer [phone]

## 2016-04-29 NOTE — Assessment & Plan Note (Signed)
Zoomed obstructive lung disease given her response to bronchodilators. She is a never smoker, consider fixed asthma. She needs repeat pulmonary function testing to better evaluate. For now we will continue her Spiriva and Symbicort

## 2016-04-29 NOTE — Patient Instructions (Signed)
Obtain your records and CT scans from your last pulmonary office in [place] We will perform pulmonary function testing Continue your Spiriva and Symbicort for now. We may decide to adjust depending on your breathing tests.  Follow with Dr Lamonte Sakai next available after your testing to adjust.

## 2016-04-29 NOTE — Assessment & Plan Note (Signed)
Secondary to HHT. She has been on adcirca before but she stopped. We will need to follow TTE, PASP.

## 2016-04-30 ENCOUNTER — Other Ambulatory Visit (HOSPITAL_BASED_OUTPATIENT_CLINIC_OR_DEPARTMENT_OTHER): Payer: Medicare Other

## 2016-04-30 ENCOUNTER — Ambulatory Visit (HOSPITAL_BASED_OUTPATIENT_CLINIC_OR_DEPARTMENT_OTHER): Payer: Medicare Other

## 2016-04-30 ENCOUNTER — Ambulatory Visit (HOSPITAL_BASED_OUTPATIENT_CLINIC_OR_DEPARTMENT_OTHER): Payer: Medicare Other | Admitting: Hematology & Oncology

## 2016-04-30 VITALS — BP 148/62 | HR 68

## 2016-04-30 VITALS — BP 119/59 | HR 74 | Temp 98.0°F | Resp 16 | Wt 163.0 lb

## 2016-04-30 DIAGNOSIS — D509 Iron deficiency anemia, unspecified: Secondary | ICD-10-CM

## 2016-04-30 DIAGNOSIS — I780 Hereditary hemorrhagic telangiectasia: Secondary | ICD-10-CM

## 2016-04-30 DIAGNOSIS — D500 Iron deficiency anemia secondary to blood loss (chronic): Secondary | ICD-10-CM

## 2016-04-30 DIAGNOSIS — I509 Heart failure, unspecified: Secondary | ICD-10-CM

## 2016-04-30 LAB — CBC WITH DIFFERENTIAL (CANCER CENTER ONLY)
BASO#: 0.1 10*3/uL (ref 0.0–0.2)
BASO%: 1.6 % (ref 0.0–2.0)
EOS%: 5.2 % (ref 0.0–7.0)
Eosinophils Absolute: 0.2 10*3/uL (ref 0.0–0.5)
HCT: 30.2 % — ABNORMAL LOW (ref 34.8–46.6)
HGB: 8.8 g/dL — ABNORMAL LOW (ref 11.6–15.9)
LYMPH#: 1.2 10*3/uL (ref 0.9–3.3)
LYMPH%: 32.5 % (ref 14.0–48.0)
MCH: 23.2 pg — ABNORMAL LOW (ref 26.0–34.0)
MCHC: 29.1 g/dL — ABNORMAL LOW (ref 32.0–36.0)
MCV: 80 fL — ABNORMAL LOW (ref 81–101)
MONO#: 0.5 10*3/uL (ref 0.1–0.9)
MONO%: 14.2 % — AB (ref 0.0–13.0)
NEUT#: 1.7 10*3/uL (ref 1.5–6.5)
NEUT%: 46.5 % (ref 39.6–80.0)
PLATELETS: 345 10*3/uL (ref 145–400)
RBC: 3.79 10*6/uL (ref 3.70–5.32)
RDW: 21.4 % — AB (ref 11.1–15.7)
WBC: 3.7 10*3/uL — AB (ref 3.9–10.0)

## 2016-04-30 LAB — FERRITIN: FERRITIN: 12 ng/mL (ref 9–269)

## 2016-04-30 LAB — IRON AND TIBC
%SAT: 6 % — ABNORMAL LOW (ref 21–57)
Iron: 20 ug/dL — ABNORMAL LOW (ref 41–142)
TIBC: 306 ug/dL (ref 236–444)
UIBC: 287 ug/dL (ref 120–384)

## 2016-04-30 MED ORDER — METHYLPREDNISOLONE SODIUM SUCC 125 MG IJ SOLR
INTRAMUSCULAR | Status: AC
  Filled 2016-04-30: qty 2

## 2016-04-30 MED ORDER — METHYLPREDNISOLONE SODIUM SUCC 125 MG IJ SOLR
125.0000 mg | Freq: Once | INTRAMUSCULAR | Status: AC
  Administered 2016-04-30: 125 mg via INTRAVENOUS

## 2016-04-30 MED ORDER — ACETAMINOPHEN 500 MG PO TABS
500.0000 mg | ORAL_TABLET | Freq: Once | ORAL | Status: AC
  Administered 2016-04-30: 500 mg via ORAL

## 2016-04-30 MED ORDER — ACETAMINOPHEN 325 MG PO TABS
ORAL_TABLET | ORAL | Status: AC
  Filled 2016-04-30: qty 2

## 2016-04-30 MED ORDER — ACETAMINOPHEN 500 MG PO TABS
ORAL_TABLET | ORAL | Status: AC
  Filled 2016-04-30: qty 1

## 2016-04-30 MED ORDER — SODIUM CHLORIDE 0.9 % IV SOLN
510.0000 mg | Freq: Once | INTRAVENOUS | Status: AC
  Administered 2016-04-30: 510 mg via INTRAVENOUS
  Filled 2016-04-30: qty 17

## 2016-04-30 NOTE — Progress Notes (Signed)
Hematology and Oncology Follow Up Visit  [name] [id number] 1945/03/31 71 y.o. 04/30/2016   Principle Diagnosis:   HHT with intermittent GI/pulmonary bleeding  Iron deficiency anemia  Current Therapy:    IV Iron with feraheme     Interim History:  [name] is back for follow-up. She is doing okay. She recently saw Dr. Lamonte Sakai. I think a another CT scan is going to be done.  She has not noted any obvious bleeding. There's been no melena.  She had iron last year. Back in December, her ferritin was 30 and her iron saturation was 12%.  She's had no fever. There's been no cough. She's had no change in bowel or bladder habits. She's had no rashes. There's been no leg swelling. .  Overall, her performance status is ECOG 1.   Medications:  Current Outpatient Prescriptions:  .  budesonide-formoterol (SYMBICORT) 160-4.5 MCG/ACT inhaler, Inhale 2 puffs into the lungs 2 (two) times daily., Disp: , Rfl:  .  fluticasone (FLONASE) 50 MCG/ACT nasal spray, Place 2 sprays into both nostrils daily as needed for allergies. , Disp: , Rfl:  .  Ipratropium-Albuterol (COMBIVENT RESPIMAT) 20-100 MCG/ACT AERS respimat, Inhale 1 puff into the lungs every 6 (six) hours as needed for wheezing or shortness of breath. , Disp: , Rfl:  .  ipratropium-albuterol (DUONEB) 0.5-2.5 (3) MG/3ML SOLN, Take 3 mLs by nebulization every 4 (four) hours as needed (For wheezing or shortness of breath.)., Disp: , Rfl:  .  lidocaine-prilocaine (EMLA) cream, Apply to port site 1 hour PRIOR to coming to the office!!, Disp: 30 g, Rfl: 6 .  metoprolol tartrate (LOPRESSOR) 25 MG tablet, Take 25 mg by mouth daily as needed (For hypertension.). , Disp: , Rfl:  .  pantoprazole (PROTONIX) 40 MG tablet, Take 40 mg by mouth daily as needed (For heartburn or acid reflux.). , Disp: , Rfl:  .  polyvinyl alcohol (LIQUIFILM TEARS) 1.4 % ophthalmic solution, Place 2 drops into both eyes as needed for dry eyes., Disp: , Rfl:   .  tiotropium (SPIRIVA) 18 MCG inhalation capsule, Place 18 mcg into inhaler and inhale daily., Disp: , Rfl:  .  traMADol (ULTRAM) 50 MG tablet, Take 50 mg by mouth every 6 (six) hours as needed (For back spasms.)., Disp: , Rfl:   Allergies:  Allergies  Allergen Reactions  . Aspirin Other (See Comments)    bleeding bleeding  . Other Other (See Comments)    All controlled substances - for fear of becoming addicted  . Latex Itching  . Morphine And Related Nausea And Vomiting    Past Medical History, Surgical history, Social history, and Family History were reviewed and updated.  Review of Systems:  As above  Physical Exam:  weight is 163 lb (73.9 kg). Her oral temperature is 98 F (36.7 C). Her blood pressure is 119/59 (abnormal) and her pulse is 74. Her respiration is 16 and oxygen saturation is 97%.   Wt Readings from Last 3 Encounters:  04/30/16 163 lb (73.9 kg)  04/29/16 163 lb 12.8 oz (74.3 kg)  02/29/16 160 lb (72.6 kg)     Head and neck exam shows no ocular or oral lesion. There are no palpable cervical or supraclavicular lymph nodes. She does have some telangiectasias on her lips. Thyroid is nonpalpable. Lungs are with some scattered crackles bilaterally. She has good air movement bilaterally. Cardiac exam regular rate and rhythm with no murmurs, rubs or bruits. Abdomen is soft. She has some slight tenderness in  the left lower quadrant. No fluid wave is noted. She has no obvious hepatomegaly. There is no splenomegaly. Extremities shows 1+ edema bilaterally. Back exam shows no tenderness over the spine, ribs or hips. She has some slight kyphosis. Skin exam shows some scattered cutaneous telangiectasias.   Lab Results  Component Value Date   WBC 3.7 (L) 04/30/2016   HGB 8.8 (L) 04/30/2016   HCT 30.2 (L) 04/30/2016   MCV 80 (L) 04/30/2016   PLT 345 04/30/2016     Chemistry      Component Value Date/Time   NA 141 01/25/2016 1454   K 3.8 01/25/2016 1454   CL 107  01/25/2016 1454   CO2 28 01/25/2016 1454   BUN 8 01/25/2016 1454   CREATININE 0.9 01/25/2016 1454      Component Value Date/Time   CALCIUM 10.6 (H) 01/25/2016 1454   ALKPHOS 160 (H) 01/25/2016 1454   AST 25 01/25/2016 1454   ALT 24 01/25/2016 1454   BILITOT 0.60 01/25/2016 1454       all Impression and Plan: [name] is A 71 year old African American female. She has HHT.I think that it is apparent that her iron is back down. Her hemoglobin is down. Her MCV is also dropping.  We will going give her iron. I'm sure this will help her out.  Her erythropoietin level was 130 so I don't think we have to give Aranesp.  I think we just have to continue to follow her closely. We will see if she needs another dose of iron.  Apparently, her Port-A-Cath is not working. With her frequent scans, we probably have to put a power port in. I think this will help her out.  I will plan to get her back to see me in about 6 weeks.   I spent about 25 minutes with her today.   Volanda Napoleon, MD 2/20/201810:29 AM

## 2016-04-30 NOTE — Addendum Note (Signed)
Addended by: Burney Gauze R on: 04/30/2016 01:14 PM   Modules accepted: Orders

## 2016-05-01 ENCOUNTER — Telehealth: Payer: Self-pay | Admitting: *Deleted

## 2016-05-01 LAB — RETICULOCYTES: Reticulocyte Count: 2.2 % (ref 0.6–2.6)

## 2016-05-01 NOTE — Telephone Encounter (Addendum)
Unable to reach patient. There is no voice mail to leave message.   ----- Message from Volanda Napoleon, MD sent at 04/30/2016  5:24 PM EST ----- Call - iron is very low!!  She will need another dose next week!!!  [name]

## 2016-05-06 ENCOUNTER — Telehealth: Payer: Self-pay | Admitting: *Deleted

## 2016-05-06 NOTE — Telephone Encounter (Addendum)
Patient is aware of results. Scheduled for second appointment.   ----- Message from Volanda Napoleon, MD sent at 04/30/2016  5:24 PM EST ----- Call - iron is very low!!  She will need another dose next week!!!  pete

## 2016-05-08 ENCOUNTER — Other Ambulatory Visit: Payer: Self-pay | Admitting: Radiology

## 2016-05-09 ENCOUNTER — Other Ambulatory Visit: Payer: Self-pay | Admitting: Radiology

## 2016-05-10 ENCOUNTER — Ambulatory Visit (HOSPITAL_COMMUNITY)
Admission: RE | Admit: 2016-05-10 | Discharge: 2016-05-10 | Disposition: A | Discharge status: Home / Self Care | Payer: Medicare Other | Source: Ambulatory Visit | Attending: Hematology & Oncology | Admitting: Hematology & Oncology

## 2016-05-10 ENCOUNTER — Other Ambulatory Visit: Payer: Self-pay | Admitting: Hematology & Oncology

## 2016-05-10 ENCOUNTER — Encounter (HOSPITAL_COMMUNITY): Payer: Self-pay

## 2016-05-10 DIAGNOSIS — I272 Pulmonary hypertension, unspecified: Secondary | ICD-10-CM | POA: Insufficient documentation

## 2016-05-10 DIAGNOSIS — Z885 Allergy status to narcotic agent status: Secondary | ICD-10-CM | POA: Diagnosis not present

## 2016-05-10 DIAGNOSIS — I471 Supraventricular tachycardia: Secondary | ICD-10-CM | POA: Insufficient documentation

## 2016-05-10 DIAGNOSIS — J449 Chronic obstructive pulmonary disease, unspecified: Secondary | ICD-10-CM | POA: Diagnosis not present

## 2016-05-10 DIAGNOSIS — Z8673 Personal history of transient ischemic attack (TIA), and cerebral infarction without residual deficits: Secondary | ICD-10-CM | POA: Insufficient documentation

## 2016-05-10 DIAGNOSIS — Z7951 Long term (current) use of inhaled steroids: Secondary | ICD-10-CM | POA: Insufficient documentation

## 2016-05-10 DIAGNOSIS — D500 Iron deficiency anemia secondary to blood loss (chronic): Secondary | ICD-10-CM

## 2016-05-10 DIAGNOSIS — D509 Iron deficiency anemia, unspecified: Secondary | ICD-10-CM | POA: Insufficient documentation

## 2016-05-10 DIAGNOSIS — T82598A Other mechanical complication of other cardiac and vascular devices and implants, initial encounter: Secondary | ICD-10-CM | POA: Diagnosis not present

## 2016-05-10 DIAGNOSIS — Y831 Surgical operation with implant of artificial internal device as the cause of abnormal reaction of the patient, or of later complication, without mention of misadventure at the time of the procedure: Secondary | ICD-10-CM | POA: Insufficient documentation

## 2016-05-10 DIAGNOSIS — N3281 Overactive bladder: Secondary | ICD-10-CM | POA: Diagnosis not present

## 2016-05-10 DIAGNOSIS — G4730 Sleep apnea, unspecified: Secondary | ICD-10-CM | POA: Insufficient documentation

## 2016-05-10 DIAGNOSIS — I780 Hereditary hemorrhagic telangiectasia: Secondary | ICD-10-CM

## 2016-05-10 DIAGNOSIS — I509 Heart failure, unspecified: Secondary | ICD-10-CM

## 2016-05-10 DIAGNOSIS — Z91040 Latex allergy status: Secondary | ICD-10-CM | POA: Diagnosis not present

## 2016-05-10 DIAGNOSIS — K219 Gastro-esophageal reflux disease without esophagitis: Secondary | ICD-10-CM | POA: Insufficient documentation

## 2016-05-10 DIAGNOSIS — Z853 Personal history of malignant neoplasm of breast: Secondary | ICD-10-CM | POA: Diagnosis not present

## 2016-05-10 DIAGNOSIS — I5020 Unspecified systolic (congestive) heart failure: Secondary | ICD-10-CM | POA: Insufficient documentation

## 2016-05-10 HISTORY — PX: IR GENERIC HISTORICAL: IMG1180011

## 2016-05-10 LAB — BASIC METABOLIC PANEL
Anion gap: 3 — ABNORMAL LOW (ref 5–15)
BUN: 10 mg/dL (ref 6–20)
CALCIUM: 10.3 mg/dL (ref 8.9–10.3)
CO2: 28 mmol/L (ref 22–32)
CREATININE: 0.9 mg/dL (ref 0.44–1.00)
Chloride: 107 mmol/L (ref 101–111)
GFR calc non Af Amer: >60 mL/min (ref >60)
Glucose, Bld: 86 mg/dL (ref 65–99)
Potassium: 6.2 mmol/L — ABNORMAL HIGH (ref 3.5–5.1)
SODIUM: 138 mmol/L (ref 135–145)

## 2016-05-10 LAB — PROTIME-INR
INR: 1
PROTHROMBIN TIME: 13.2 s (ref 11.4–15.2)

## 2016-05-10 LAB — CBC
HCT: 34.9 % — ABNORMAL LOW (ref 36.0–46.0)
Hemoglobin: 10.5 g/dL — ABNORMAL LOW (ref 12.0–15.0)
MCH: 24.9 pg — ABNORMAL LOW (ref 26.0–34.0)
MCHC: 30.1 g/dL (ref 30.0–36.0)
MCV: 82.9 fL (ref 78.0–100.0)
PLATELETS: 229 10*3/uL (ref 150–400)
RBC: 4.21 MIL/uL (ref 3.87–5.11)
RDW: 25.5 % — ABNORMAL HIGH (ref 11.5–15.5)
WBC: 4 10*3/uL (ref 4.0–10.5)

## 2016-05-10 LAB — APTT: aPTT: 32 seconds (ref 24–36)

## 2016-05-10 MED ORDER — FENTANYL CITRATE (PF) 100 MCG/2ML IJ SOLN
INTRAMUSCULAR | Status: AC
  Filled 2016-05-10: qty 4

## 2016-05-10 MED ORDER — LIDOCAINE-EPINEPHRINE (PF) 2 %-1:200000 IJ SOLN
INTRAMUSCULAR | Status: AC
  Filled 2016-05-10: qty 20

## 2016-05-10 MED ORDER — CEFAZOLIN SODIUM-DEXTROSE 2-4 GM/100ML-% IV SOLN
INTRAVENOUS | Status: AC
  Administered 2016-05-10: 2 g via INTRAVENOUS
  Filled 2016-05-10: qty 100

## 2016-05-10 MED ORDER — IOPAMIDOL (ISOVUE-300) INJECTION 61%
INTRAVENOUS | Status: AC
  Administered 2016-05-10: 10 mL
  Filled 2016-05-10: qty 50

## 2016-05-10 MED ORDER — SODIUM CHLORIDE 0.9 % IV SOLN
INTRAVENOUS | Status: DC
Start: 2016-05-10 — End: 2016-05-11
  Administered 2016-05-10: 10:00:00 via INTRAVENOUS

## 2016-05-10 MED ORDER — MIDAZOLAM HCL 2 MG/2ML IJ SOLN
INTRAMUSCULAR | Status: AC
  Filled 2016-05-10: qty 6

## 2016-05-10 MED ORDER — FENTANYL CITRATE (PF) 100 MCG/2ML IJ SOLN
INTRAMUSCULAR | Status: AC | PRN
  Administered 2016-05-10 (×2): 25 ug via INTRAVENOUS
  Administered 2016-05-10: 50 ug via INTRAVENOUS

## 2016-05-10 MED ORDER — MIDAZOLAM HCL 2 MG/2ML IJ SOLN
INTRAMUSCULAR | Status: AC | PRN
  Administered 2016-05-10 (×4): 1 mg via INTRAVENOUS

## 2016-05-10 MED ORDER — HEPARIN SOD (PORK) LOCK FLUSH 100 UNIT/ML IV SOLN
INTRAVENOUS | Status: AC
  Filled 2016-05-10: qty 5

## 2016-05-10 MED ORDER — CEFAZOLIN SODIUM-DEXTROSE 2-4 GM/100ML-% IV SOLN
2.0000 g | INTRAVENOUS | Status: AC
  Administered 2016-05-10: 2 g via INTRAVENOUS

## 2016-05-10 MED ORDER — HEPARIN SOD (PORK) LOCK FLUSH 100 UNIT/ML IV SOLN
INTRAVENOUS | Status: AC | PRN
  Administered 2016-05-10: 500 [IU]

## 2016-05-10 MED ORDER — LIDOCAINE-EPINEPHRINE (PF) 2 %-1:200000 IJ SOLN
INTRAMUSCULAR | Status: AC | PRN
  Administered 2016-05-10: 20 mL

## 2016-05-10 NOTE — H&P (Signed)
Referring Physician(s): Ennever,Peter R  Supervising Physician: Sandi Mariscal  Patient Status:  [facility]  Chief Complaint:  "I'm here to get my port fixed"  Subjective: Familiar to IR service from prior left paraophthalmic aneurysm stenting in 2008. She has a history of HHT/Osler-Weber- Rendu syndrome as well as iron deficiency anemia. She requires long term central venous access for repeated scans, iron infusions/ blood transfusions and frequent lab work.She has a right chest wall port a cath which was placed in [place] approx 20 years ago. For the past year she has had difficulty with blood return from the Port-A-Cath. She presents today for Port-A-Cath revision or new port placement.She currently denies fever, headache, chest pain,  cough,  back pain, nausea, vomiting. She will occasionally have some dyspnea with exertion as well as episodes of bleeding due to HHT. Past Medical History:  Diagnosis Date  . Allergic rhinitis   . Anemia   . Breast cancer ([place])   . Chronic bronchiolitis ([place])   . Chronic bronchiolitis ([place])   . CVA (cerebral vascular accident) ([place])   . Degenerative lumbar disc   . GERD (gastroesophageal reflux disease)   . Hemorrhage   . OAB (overactive bladder)   . 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 Peroneal neuropathy   . Pulmonary HTN   . Retinal detachment   . Sleep apnea   . SVT (supraventricular tachycardia) ([place])   . Systolic heart failure ([facility])   No past surgical history on file.    Allergies: Aspirin; Other; Latex; and Morphine and related  Medications: Prior to Admission medications   Medication Sig Start Date End Date Taking? Authorizing Provider  budesonide-formoterol (SYMBICORT) 160-4.5 MCG/ACT inhaler Inhale 2 puffs into the lungs 2 (two) times daily.    Historical Provider, MD  fluticasone (FLONASE) 50 MCG/ACT nasal spray Place 2 sprays into both nostrils daily as needed for allergies.     Historical Provider, MD    Ipratropium-Albuterol (COMBIVENT RESPIMAT) 20-100 MCG/ACT AERS respimat Inhale 1 puff into the lungs every 6 (six) hours as needed for wheezing or shortness of breath.     Historical Provider, MD  ipratropium-albuterol (DUONEB) 0.5-2.5 (3) MG/3ML SOLN Take 3 mLs by nebulization every 4 (four) hours as needed (For wheezing or shortness of breath.).    Historical Provider, MD  lidocaine-prilocaine (EMLA) cream Apply to port site 1 hour PRIOR to coming to the office!! 01/26/16   Volanda Napoleon, MD  metoprolol tartrate (LOPRESSOR) 25 MG tablet Take 25 mg by mouth daily as needed (For hypertension.).     Historical Provider, MD  pantoprazole (PROTONIX) 40 MG tablet Take 40 mg by mouth daily as needed (For heartburn or acid reflux.).     Historical Provider, MD  polyvinyl alcohol (LIQUIFILM TEARS) 1.4 % ophthalmic solution Place 2 drops into both eyes as needed for dry eyes.    Historical Provider, MD  tiotropium (SPIRIVA) 18 MCG inhalation capsule Place 18 mcg into inhaler and inhale daily.    Historical Provider, MD  traMADol (ULTRAM) 50 MG tablet Take 50 mg by mouth every 6 (six) hours as needed (For back spasms.).    Historical Provider, MD     Vital Signs: BP (!) 166/93 (BP Location: Left Arm)   Pulse 71   Temp 97.7 F (36.5 C) (Oral)   Resp 18   SpO2 100%   Physical Exam Patient awake, alert. Chest clear to auscultation bilaterally. Clean, intact right chest wall Port-A-Cath. Heart with regular rate and rhythm. Abdomen  soft, positive bowel sounds, nontender. Lower extremities with no significant edema  Imaging: No results found.  Labs:  CBC:  Recent Labs  01/03/16 1025 01/25/16 1454 02/29/16 1127 04/30/16 0947  WBC 4.4 4.1 3.3* 3.7*  HGB 8.5* 8.1* 10.1* 8.8*  HCT 29.7* 28.9* 34.0* 30.2*  PLT 297 324 238 345    COAGS: No results for input(s): INR, APTT in the last 8760 hours.  BMP:  Recent Labs  12/22/15 0139 01/02/16 1041 01/03/16 1025 01/25/16 1454  NA 139 139  141 141  K 4.2 4.3 3.4* 3.8  CL 105 108 107 107  CO2 28 24 28 28   GLUCOSE 87 85 100* 85  BUN 8 11 11 8   CALCIUM 10.6* 10.3 10.2 10.6*  CREATININE 0.71 0.72 0.79 0.9  GFRNONAA >60 >60 >60  --   GFRAA >60 >60 >60  --     LIVER FUNCTION TESTS:  Recent Labs  01/25/16 1454  BILITOT 0.60  AST 25  ALT 24  ALKPHOS 160*  PROT 7.1  ALBUMIN 3.4    Assessment and Plan: Pt with history of HHT/Osler-Weber- Rendu syndrome as well as iron deficiency anemia. She requires long term central venous access for repeated scans, iron infusions/ blood transfusions and frequent lab work.She has a right chest wall port a cath which was placed in [place] approx 20 years ago. For the past year she has had difficulty with blood return from the Port-A-Cath. She presents today for Port-A-Cath revision or new port placement.Risks and benefits discussed with the patient including, but not limited to bleeding, infection, pneumothorax, or fibrin sheath development and need for additional procedures. All of the patient's questions were answered, patient is agreeable to proceed. Consent signed and in chart. Labs pending.      Electronically Signed: D. Rowe Robert 05/10/2016, 9:52 AM   I spent a total of 25 minutes at the the patient's bedside AND on the patient's hospital floor or unit, greater than 50% of which was counseling/coordinating care for Port-A-Cath revision/new placement

## 2016-05-10 NOTE — Procedures (Signed)
Pre Procedure Dx: Poor venous access Post Procedural Dx: Same  Successful removal of existing R SCV approach port-a-catheter. (Note, a R CFV sheath was placed in anticipation that if there was any difficulty removing this port, the line could be cannulated and snared to capture any potential FB).  Successful placement of right IJ approach port-a-cath with tip at the superior caval atrial junction. The catheter is ready for immediate use.  Estimated Blood Loss: Minimal  Complications: None immediate.  Ronny Bacon, MD Pager #: [phone]

## 2016-05-10 NOTE — Discharge Instructions (Signed)
Implanted Port Home Guide °An implanted port is a type of central line that is placed under the skin. Central lines are used to provide IV access when treatment or nutrition needs to be given through a person’s veins. Implanted ports are used for long-term IV access. An implanted port may be placed because: °· You need IV medicine that would be irritating to the small veins in your hands or arms. °· You need long-term IV medicines, such as antibiotics. °· You need IV nutrition for a long period. °· You need frequent blood draws for lab tests. °· You need dialysis. °Implanted ports are usually placed in the chest area, but they can also be placed in the upper arm, the abdomen, or the leg. An implanted port has two main parts: °· Reservoir. The reservoir is round and will appear as a small, raised area under your skin. The reservoir is the part where a needle is inserted to give medicines or draw blood. °· Catheter. The catheter is a thin, flexible tube that extends from the reservoir. The catheter is placed into a large vein. Medicine that is inserted into the reservoir goes into the catheter and then into the vein. °How will I care for my incision site? °Do not get the incision site wet. Bathe or shower as directed by your health care provider. °How is my port accessed? °Special steps must be taken to access the port: °· Before the port is accessed, a numbing cream can be placed on the skin. This helps numb the skin over the port site. °· Your health care provider uses a sterile technique to access the port. °¨ Your health care provider must put on a mask and sterile gloves. °¨ The skin over your port is cleaned carefully with an antiseptic and allowed to dry. °¨ The port is gently pinched between sterile gloves, and a needle is inserted into the port. °· Only "non-coring" port needles should be used to access the port. Once the port is accessed, a blood return should be checked. This helps ensure that the port is  in the vein and is not clogged. °· If your port needs to remain accessed for a constant infusion, a clear (transparent) bandage will be placed over the needle site. The bandage and needle will need to be changed every week, or as directed by your health care provider. °· Keep the bandage covering the needle clean and dry. Do not get it wet. Follow your health care provider’s instructions on how to take a shower or bath while the port is accessed. °· If your port does not need to stay accessed, no bandage is needed over the port. °What is flushing? °Flushing helps keep the port from getting clogged. Follow your health care provider’s instructions on how and when to flush the port. Ports are usually flushed with saline solution or a medicine called heparin. The need for flushing will depend on how the port is used. °· If the port is used for intermittent medicines or blood draws, the port will need to be flushed: °¨ After medicines have been given. °¨ After blood has been drawn. °¨ As part of routine maintenance. °· If a constant infusion is running, the port may not need to be flushed. °How long will my port stay implanted? °The port can stay in for as long as your health care provider thinks it is needed. When it is time for the port to come out, surgery will be done to remove it.   The procedure is similar to the one performed when the port was put in. °When should I seek immediate medical care? °When you have an implanted port, you should seek immediate medical care if: °· You notice a bad smell coming from the incision site. °· You have swelling, redness, or drainage at the incision site. °· You have more swelling or pain at the port site or the surrounding area. °· You have a fever that is not controlled with medicine. °This information is not intended to replace advice given to you by your health care provider. Make sure you discuss any questions you have with your health care provider. °Document Released:  02/25/2005 Document Revised: 08/03/2015 Document Reviewed: 11/02/2012 °Elsevier Interactive Patient Education © 2017 [employer] °Implanted Port Insertion, Care After °This sheet gives you information about how to care for yourself after your procedure. Your health care provider may also give you more specific instructions. If you have problems or questions, contact your health care provider. °What can I expect after the procedure? °After your procedure, it is common to have: °· Discomfort at the port insertion site. °· Bruising on the skin over the port. This should improve over 3-4 days. °Follow these instructions at home: °Port care  °· After your port is placed, you will get a manufacturer's information card. The card has information about your port. Keep this card with you at all times. °· Take care of the port as told by your health care provider. Ask your health care provider if you or a family member can get training for taking care of the port at home. A home health care nurse may also take care of the port. °· Make sure to remember what type of port you have. °Incision care  °· Follow instructions from your health care provider about how to take care of your port insertion site. Make sure you: °¨ Wash your hands with soap and water before you change your bandage (dressing). If soap and water are not available, use hand sanitizer. °¨ Change your dressing as told by your health care provider. °¨ Leave stitches (sutures), skin glue, or adhesive strips in place. These skin closures may need to stay in place for 2 weeks or longer. If adhesive strip edges start to loosen and curl up, you may trim the loose edges. Do not remove adhesive strips completely unless your health care provider tells you to do that. °· Check your port insertion site every day for signs of infection. Check for: °¨ More redness, swelling, or pain. °¨ More fluid or blood. °¨ Warmth. °¨ Pus or a bad smell. °General instructions  °· Do not  take baths, swim, or use a hot tub until your health care provider approves. °· Do not lift anything that is heavier than 10 lb (4.5 kg) for a week, or as told by your health care provider. °· Ask your health care provider when it is okay to: °¨ Return to work or school. °¨ Resume usual physical activities or sports. °· Do not drive for 24 hours if you were given a medicine to help you relax (sedative). °· Take over-the-counter and prescription medicines only as told by your health care provider. °· Wear a medical alert bracelet in case of an emergency. This will tell any health care providers that you have a port. °· Keep all follow-up visits as told by your health care provider. This is important. °Contact a health care provider if: °· You cannot flush your port   with saline as directed, or you cannot draw blood from the port.  You have a fever or chills.  You have more redness, swelling, or pain around your port insertion site.  You have more fluid or blood coming from your port insertion site.  Your port insertion site feels warm to the touch.  You have pus or a bad smell coming from the port insertion site. Get help right away if:  You have chest pain or shortness of breath.  You have bleeding from your port that you cannot control. Summary  Take care of the port as told by your health care provider.  Change your dressing as told by your health care provider.  Keep all follow-up visits as told by your health care provider. This information is not intended to replace advice given to you by your health care provider. Make sure you discuss any questions you have with your health care provider. Document Released: 12/16/2012 Document Revised: 01/17/2016 Document Reviewed: 01/17/2016 Elsevier Interactive Patient Education  2017 [place]. Moderate Conscious Sedation, Adult, Care After These instructions provide you with information about caring for yourself after your procedure. Your  health care provider may also give you more specific instructions. Your treatment has been planned according to current medical practices, but problems sometimes occur. Call your health care provider if you have any problems or questions after your procedure. What can I expect after the procedure? After your procedure, it is common:  To feel sleepy for several hours.  To feel clumsy and have poor balance for several hours.  To have poor judgment for several hours.  To vomit if you eat too soon. Follow these instructions at home: For at least 24 hours after the procedure:    Do not:  Participate in activities where you could fall or become injured.  Drive.  Use heavy machinery.  Drink alcohol.  Take sleeping pills or medicines that cause drowsiness.  Make important decisions or sign legal documents.  Take care of children on your own.  Rest. Eating and drinking   Follow the diet recommended by your health care provider.  If you vomit:  Drink water, juice, or soup when you can drink without vomiting.  Make sure you have little or no nausea before eating solid foods. General instructions   Have a responsible adult stay with you until you are awake and alert.  Take over-the-counter and prescription medicines only as told by your health care provider.  If you smoke, do not smoke without supervision.  Keep all follow-up visits as told by your health care provider. This is important. Contact a health care provider if:  You keep feeling nauseous or you keep vomiting.  You feel light-headed.  You develop a rash.  You have a fever. Get help right away if:  You have trouble breathing. This information is not intended to replace advice given to you by your health care provider. Make sure you discuss any questions you have with your health care provider. Document Released: 12/16/2012 Document Revised: 07/31/2015 Document Reviewed: 06/17/2015 Elsevier Interactive  Patient Education  2017 [employer]    You may removed dressing after 1:00 pm tomorrow-05-11-2016, . You do not have to put anything back on it.

## 2016-05-14 ENCOUNTER — Other Ambulatory Visit: Payer: Self-pay

## 2016-05-14 ENCOUNTER — Ambulatory Visit: Payer: Medicare Other

## 2016-05-14 ENCOUNTER — Ambulatory Visit (HOSPITAL_BASED_OUTPATIENT_CLINIC_OR_DEPARTMENT_OTHER): Payer: Medicare Other

## 2016-05-14 VITALS — BP 146/70 | HR 89 | Temp 97.7°F

## 2016-05-14 DIAGNOSIS — D500 Iron deficiency anemia secondary to blood loss (chronic): Secondary | ICD-10-CM

## 2016-05-14 DIAGNOSIS — I780 Hereditary hemorrhagic telangiectasia: Secondary | ICD-10-CM

## 2016-05-14 DIAGNOSIS — D509 Iron deficiency anemia, unspecified: Secondary | ICD-10-CM

## 2016-05-14 MED ORDER — METHYLPREDNISOLONE SODIUM SUCC 125 MG IJ SOLR
INTRAMUSCULAR | Status: AC
  Filled 2016-05-14: qty 2

## 2016-05-14 MED ORDER — METHYLPREDNISOLONE SODIUM SUCC 125 MG IJ SOLR
125.0000 mg | Freq: Once | INTRAMUSCULAR | Status: AC
  Administered 2016-05-14: 125 mg via INTRAVENOUS

## 2016-05-14 MED ORDER — METOPROLOL TARTRATE 25 MG PO TABS: 25.0000 mg | ORAL_TABLET | Freq: Every day | ORAL | 3 refills | Status: AC | PRN

## 2016-05-14 MED ORDER — ACETAMINOPHEN 500 MG PO TABS
ORAL_TABLET | ORAL | Status: AC
  Filled 2016-05-14: qty 1

## 2016-05-14 MED ORDER — TRAMADOL HCL 50 MG PO TABS: 50.0000 mg | ORAL_TABLET | Freq: Four times a day (QID) | ORAL | 0 refills | Status: AC | PRN

## 2016-05-14 MED ORDER — ACETAMINOPHEN 500 MG PO TABS
500.0000 mg | ORAL_TABLET | Freq: Once | ORAL | Status: AC
  Administered 2016-05-14: 500 mg via ORAL

## 2016-05-14 MED ORDER — SODIUM CHLORIDE 0.9 % IV SOLN
510.0000 mg | Freq: Once | INTRAVENOUS | Status: AC
  Administered 2016-05-14: 510 mg via INTRAVENOUS
  Filled 2016-05-14: qty 17

## 2016-05-22 ENCOUNTER — Ambulatory Visit (INDEPENDENT_AMBULATORY_CARE_PROVIDER_SITE_OTHER): Payer: Medicare Other | Admitting: Emergency Medicine

## 2016-05-22 ENCOUNTER — Encounter: Payer: Self-pay | Admitting: Emergency Medicine

## 2016-05-22 ENCOUNTER — Ambulatory Visit (HOSPITAL_COMMUNITY)
Admission: RE | Admit: 2016-05-22 | Discharge: 2016-05-22 | Disposition: A | Discharge status: Home / Self Care | Payer: Medicare Other | Source: Ambulatory Visit | Attending: Emergency Medicine | Admitting: Emergency Medicine

## 2016-05-22 DIAGNOSIS — I780 Hereditary hemorrhagic telangiectasia: Secondary | ICD-10-CM

## 2016-05-22 DIAGNOSIS — R911 Solitary pulmonary nodule: Secondary | ICD-10-CM | POA: Diagnosis not present

## 2016-05-22 DIAGNOSIS — J449 Chronic obstructive pulmonary disease, unspecified: Secondary | ICD-10-CM | POA: Diagnosis not present

## 2016-05-22 LAB — PULMONARY FUNCTION TEST
DL/VA % PRED: 71 %
DL/VA: 3.6 ml/min/mmHg/L
DLCO UNC % PRED: 43 %
DLCO unc: 11.64 ml/min/mmHg
FEF 25-75 POST: 1.35 L/s
FEF 25-75 Pre: 0.62 L/sec
FEF2575-%CHANGE-POST: 116 %
FEF2575-%PRED-POST: 74 %
FEF2575-%Pred-Pre: 34 %
FEV1-%Change-Post: 26 %
FEV1-%PRED-PRE: 60 %
FEV1-%Pred-Post: 76 %
FEV1-POST: 1.53 L
FEV1-Pre: 1.21 L
FEV1FVC-%CHANGE-POST: 7 %
FEV1FVC-%PRED-PRE: 82 %
FEV6-%CHANGE-POST: 17 %
FEV6-%PRED-POST: 89 %
FEV6-%Pred-Pre: 76 %
FEV6-Post: 2.22 L
FEV6-Pre: 1.89 L
FEV6FVC-%Change-Post: 0 %
FEV6FVC-%PRED-PRE: 102 %
FEV6FVC-%Pred-Post: 103 %
FVC-%CHANGE-POST: 17 %
FVC-%PRED-POST: 86 %
FVC-%Pred-Pre: 74 %
FVC-Post: 2.24 L
FVC-Pre: 1.91 L
POST FEV1/FVC RATIO: 68 %
PRE FEV6/FVC RATIO: 99 %
Post FEV6/FVC ratio: 99 %
Pre FEV1/FVC ratio: 63 %
RV % PRED: 110 %
RV: 2.53 L
TLC % pred: 83 %
TLC: 4.46 L

## 2016-05-22 MED ORDER — ALBUTEROL SULFATE (2.5 MG/3ML) 0.083% IN NEBU
2.5000 mg | INHALATION_SOLUTION | Freq: Once | RESPIRATORY_TRACT | Status: AC
  Administered 2016-05-22: 2.5 mg via RESPIRATORY_TRACT

## 2016-05-22 NOTE — Progress Notes (Signed)
Subjective:    Patient ID: [name], female    DOB: Nov 22, 1945, 71 y.o.   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 She has been managed on adcirca before for the [facility], but she only took for 6 months, stopped it because she did not feel it helped her. She has had several pulmonary embolizations, most recent was 2 years ago in [place].   She has been on amiodarone, stopped in 12/2015.   She is also on BD's > spiriva and symbicort, combivent prn.   ROV 05/22/16 - follow up visit for HHT and AVM's, also for obstructive lung disease on BD's. She has a port in place, just changed beginning of the month, for Fe and blood draws. She underwent PFT today that I have reviewed, shows moderately severe obstruction with + BD response, normal volumes, decreased diffusion capacity. Her breathing has ben stable. I still do not have her CT's from [place] to compare to her current CT 11/17   Review of Systems  Constitutional: Negative for fever and unexpected weight change.  HENT: Positive for dental problem. Negative for congestion, ear pain, nosebleeds, postnasal drip, rhinorrhea, sinus pressure, sneezing, sore throat and trouble swallowing.   Eyes: Negative for redness and itching.  Respiratory: Positive for shortness of breath. Negative for cough, chest tightness and wheezing.   Cardiovascular: Positive for palpitations. Negative for leg swelling.  Gastrointestinal: Negative for nausea and vomiting.  Genitourinary: Negative for dysuria.  Musculoskeletal: Negative for joint swelling.  Skin: Negative for rash.  Neurological: Negative for headaches.  Hematological: Does not bruise/bleed easily.  Psychiatric/Behavioral: Negative for dysphoric mood. The patient is not  nervous/anxious.     Past Medical History:  Diagnosis Date  . Allergic rhinitis   . Anemia   . Breast cancer ([place])   . Chronic bronchiolitis ([place])   . Chronic bronchiolitis ([place])   . CVA (cerebral vascular accident) ([place])   . Degenerative lumbar disc   . GERD (gastroesophageal reflux disease)   . Hemorrhage   . OAB (overactive bladder)   . 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 Peroneal neuropathy   . Pulmonary HTN   . Retinal detachment   . Sleep apnea   . SVT (supraventricular tachycardia) ([place])   . Systolic heart failure ([facility])      Family History  Problem Relation Age of Onset  . Colon cancer Father      Social History   Social History  . Marital status: Legally Separated    Spouse name: N/A  . Number of children: N/A  . Years of education: N/A   Occupational History  . Not on file.   Social History Main Topics  . Smoking status: Never Smoker  . Smokeless tobacco: Never Used  . Alcohol use No  . Drug use: No  . Sexual activity: Not on file   Other Topics Concern  . Not on file   Social History Narrative  . No narrative on file  has lived in [place], [place], [place],   Allergies  Allergen Reactions  . Aspirin Other (See Comments)    bleeding bleeding  . Other Other (See Comments)    All controlled substances - for fear of becoming addicted  . Latex Itching  . Morphine And Related Nausea And Vomiting  Outpatient Medications Prior to Visit  Medication Sig Dispense Refill  . budesonide-formoterol (SYMBICORT) 160-4.5 MCG/ACT inhaler Inhale 2 puffs into the lungs 2 (two) times daily.    . fluticasone (FLONASE) 50 MCG/ACT nasal spray Place 2 sprays into both nostrils daily as needed for allergies.     . Ipratropium-Albuterol (COMBIVENT RESPIMAT) 20-100 MCG/ACT AERS respimat Inhale 1 puff into the lungs every 6 (six) hours as needed for wheezing or shortness of breath.     Marland Kitchen ipratropium-albuterol (DUONEB) 0.5-2.5 (3) MG/3ML SOLN Take 3 mLs by nebulization every 4  (four) hours as needed (For wheezing or shortness of breath.).    Marland Kitchen lidocaine-prilocaine (EMLA) cream Apply to port site 1 hour PRIOR to coming to the office!! 30 g 6  . metoprolol tartrate (LOPRESSOR) 25 MG tablet Take 1 tablet (25 mg total) by mouth daily as needed (For hypertension.). 30 tablet 3  . pantoprazole (PROTONIX) 40 MG tablet Take 40 mg by mouth daily as needed (For heartburn or acid reflux.).     Marland Kitchen polyvinyl alcohol (LIQUIFILM TEARS) 1.4 % ophthalmic solution Place 2 drops into both eyes as needed for dry eyes.    Marland Kitchen tiotropium (SPIRIVA) 18 MCG inhalation capsule Place 18 mcg into inhaler and inhale daily.    . traMADol (ULTRAM) 50 MG tablet Take 1 tablet (50 mg total) by mouth every 6 (six) hours as needed (For back spasms.). 90 tablet 0   No facility-administered medications prior to visit.          Objective:   Physical Exam Vitals:   05/22/16 1643  BP: 118/80  Pulse: 64  SpO2: 100%  Weight: 163 lb (73.9 kg)  Height: 5\' 6"  (1.676 m)   Gen: Pleasant, well-nourished, in no distress,  normal affect  ENT: She has 3 punctate telangiectasias on surface of her tongue. She also has some areas of blushing on the under surface of her tongue. Finally she has some evidence of telangiectasias in both nares.  Neck: No JVD, no TMG, no carotid bruits  Lungs: No use of accessory muscles, clear without rales or rhonchi  Cardiovascular: RRR, heart sounds normal, no murmur or gallops, no peripheral edema  Musculoskeletal: No deformities, no cyanosis or clubbing  Neuro: alert, non focal  Skin: Warm, no lesions or rashes     CT chest 01/29/16 --  COMPARISON:  09/13/2008 abdominal sonogram. 06/02/2007 chest CT angiogram.  FINDINGS: CT CHEST FINDINGS  Cardiovascular: Moderate cardiomegaly. Trace pericardial effusion/thickening. Atherosclerotic nonaneurysmal thoracic aorta. Dilated main pulmonary artery (3.5 cm diameter), increased from 3.0 cm on 06/02/2007. No central  pulmonary emboli.  Mediastinum/Nodes: No discrete thyroid nodules. Unremarkable esophagus. No axillary adenopathy. Mildly enlarged 1.2 cm right paratracheal node (series 2/image 18), not appreciably changed since 06/02/2007. No additional pathologically enlarged mediastinal or hilar nodes.  Lungs/Pleura: No pneumothorax. No pleural effusion. Stable mild subpleural reticulation in the anterior right upper and right middle lobes, suggestive of mild radiation fibrosis. Stable irregular parenchymal band in the peripheral right lower lobe, consistent with postinfectious/postinflammatory scarring. There are two new subsolid 6 mm pulmonary nodules in the central right lower lobe (series 6/ images 94 and 99). New 7 mm solid left lower lobe pulmonary nodule (series 6/ image 71), which appears to communicate with the adjacent pulmonary artery branches and may represent a pulmonary AVM. Interval embolization coils are seen in a medial basilar left lower lobe pulmonary artery branch. Posterior left lower lobe 7 x 5 mm arteriovenous malformation (series 6/image 106), decreased from 11 x  9 mm on 06/02/2007. No additional potential pulmonary arteriovenous malformations. No acute consolidative airspace disease.  Musculoskeletal: No aggressive appearing focal osseous lesions. Mild thoracic spondylosis. Postsurgical changes are again noted from lumpectomy in the upper right breast.  CT ABDOMEN PELVIS FINDINGS  Hepatobiliary: There is relative hypertrophy of the left liver lobe with the suggestion of a finely irregular liver surface, which may indicate cirrhosis. No liver mass. There is marked hypertrophy of the tortuous common and proper hepatic artery and intrahepatic arterial branches, with accessory dilated left hepatic artery arising from the left gastric artery. There is a suggestion of multiple arterial venous fistulas between the dilated hepatic artery branches and hepatic veins, for  example in the medial segment left liver lobe (series 2/ image 52). These findings appear chronic and not definitely changed from 06/02/2007. Normal gallbladder with no radiopaque cholelithiasis. No biliary ductal dilatation.  Pancreas: Normal, with no mass or duct dilation.  Spleen: Normal size spleen. Stable granulomatous calcification in the spleen. No splenic mass.  Adrenals/Urinary Tract: Normal right adrenal. Stable 2.9 cm left adrenal adenoma. No hydronephrosis. Subcentimeter hypodense renal cortical lesions in both kidneys are too small to characterize and require no further follow-up. Normal bladder.  Stomach/Bowel: Grossly normal stomach. Normal caliber small bowel with no small bowel wall thickening. Normal appendix. Mild sigmoid diverticulosis, with no large bowel wall thickening or pericolonic fat stranding. Oral contrast reaches the sigmoid colon.  Vascular/Lymphatic: Atherosclerotic nonaneurysmal abdominal aorta. Patent portal, splenic, hepatic and renal veins. No pathologically enlarged lymph nodes in the abdomen or pelvis.  Reproductive: Status post hysterectomy, with no abnormal findings at the vaginal cuff. No adnexal mass.  Other: No pneumoperitoneum, ascites or focal fluid collection.  Musculoskeletal: No aggressive appearing focal osseous lesions. Mild lumbar spondylosis.  IMPRESSION: 1. New 7 mm solid left lower lobe nodule, which appears to communicate with the pulmonary arterial tree, suggesting a new pulmonary AVM. 2. Previously described posterior left lower lobe pulmonary AVM has decreased in size status post interval embolization. 3. Two 6 mm subsolid central right lower lobe pulmonary nodules are new. Non-contrast chest CT at 3-6 months is recommended. If nodules persist, subsequent management will be based upon the most suspicious nodule(s). This recommendation follows the consensus statement: Guidelines for Management of Incidental  Pulmonary Nodules Detected on CT Images: From the Fleischner Society 2017; Radiology 2017; 284:228-243. 4. Moderate cardiomegaly. Trace pericardial effusion/thickening. Dilated main pulmonary artery, increased, suggesting pulmonary arterial hypertension. 5. Mild right paratracheal lymphadenopathy is stable since 2009, consistent with benign reactive adenopathy. 6. Morphologic changes in the liver suggestive of cirrhosis. 7. Chronic arteriovenous malformation between the prominently dilated tortuous hepatic arteries and hepatic veins. 8. Additional findings include aortic atherosclerosis, stable left adrenal adenoma and mild sigmoid diverticulosis.    02/08/16:  Study Conclusions - Left ventricle: The cavity size was normal. Wall thickness was   increased in a pattern of mild LVH. Systolic function was normal.   The estimated ejection fraction was in the range of 55% to 60%. - Aortic valve: AV is thickened, calciefied with mildly restricted   motion Peak and mean gradients through the valve are 19 and 10 mm   Hg respectively. - Mitral valve: There was mild regurgitation. - Left atrium: The atrium was severely dilated. - Right ventricle: The cavity size was mildly dilated. Systolic   function was mildly reduced. - Right atrium: The atrium was severely dilated. - Pulmonic valve: There was moderate regurgitation. - Pulmonary arteries: PA peak pressure: 75 mm Hg (S). -  Pericardium, extracardiac: A trivial pericardial effusion was   identified.      Assessment & Plan:  Obstructive lung disease ([place]) Please continue your Symbicort and Spiriva as you are taking them.    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 Will repeat Ct w contrast at 6 months. Also try again to get her scans from [place]   Harriet Bollen, MD, PhD 05/22/2016, 5:27 PM [place]  [facility] [phone] or if no answer [phone]

## 2016-05-22 NOTE — Assessment & Plan Note (Signed)
Appears to be contiguous with her known AVM but will need to be followed. Will repeat Ct w contrast at 6 months. Also try again to get her scans from [place]

## 2016-05-22 NOTE — Patient Instructions (Addendum)
Please continue your Symbicort and Spiriva as you are taking them.  We will plan to repeat your CT scan of the chest with contrast in June 2018 to follow your left lower lobe nodule.  Follow with Dr Lamonte Sakai in June after your Ct scan to review the results.

## 2016-05-22 NOTE — Assessment & Plan Note (Signed)
S/p embolization of AVMs in the past

## 2016-05-22 NOTE — Assessment & Plan Note (Signed)
Please continue your Symbicort and Spiriva as you are taking them.

## 2016-06-11 ENCOUNTER — Other Ambulatory Visit: Payer: Medicare Other

## 2016-06-11 ENCOUNTER — Ambulatory Visit: Payer: Medicare Other | Admitting: Hematology & Oncology

## 2016-06-11 ENCOUNTER — Ambulatory Visit: Payer: Medicare Other

## 2016-08-22 ENCOUNTER — Inpatient Hospital Stay: Admission: RE | Admit: 2016-08-22 | Payer: Medicare Other | Source: Ambulatory Visit
